# Patient Record
Sex: Male | Born: 1960
Health system: Southern US, Community
[De-identification: ages and names within clinical notes are randomized; demographics above are authoritative.]

## PROBLEM LIST (undated history)

## (undated) DIAGNOSIS — K219 Gastro-esophageal reflux disease without esophagitis: Secondary | ICD-10-CM

## (undated) DIAGNOSIS — K259 Gastric ulcer, unspecified as acute or chronic, without hemorrhage or perforation: Secondary | ICD-10-CM

## (undated) DIAGNOSIS — J302 Other seasonal allergic rhinitis: Secondary | ICD-10-CM

## (undated) DIAGNOSIS — G43909 Migraine, unspecified, not intractable, without status migrainosus: Secondary | ICD-10-CM

## (undated) DIAGNOSIS — E785 Hyperlipidemia, unspecified: Secondary | ICD-10-CM

## (undated) DIAGNOSIS — D126 Benign neoplasm of colon, unspecified: Secondary | ICD-10-CM

## (undated) DIAGNOSIS — M1990 Unspecified osteoarthritis, unspecified site: Secondary | ICD-10-CM

## (undated) DIAGNOSIS — T7840XA Allergy, unspecified, initial encounter: Secondary | ICD-10-CM

## (undated) HISTORY — DX: Benign neoplasm of colon, unspecified: D12.6

## (undated) HISTORY — DX: Other seasonal allergic rhinitis: J30.2

## (undated) HISTORY — DX: Unspecified osteoarthritis, unspecified site: M19.90

## (undated) HISTORY — PX: UPPER GASTROINTESTINAL ENDOSCOPY: SHX188

## (undated) HISTORY — DX: Allergy, unspecified, initial encounter: T78.40XA

## (undated) HISTORY — DX: Gastro-esophageal reflux disease without esophagitis: K21.9

## (undated) HISTORY — PX: POLYPECTOMY: SHX149

## (undated) HISTORY — DX: Gastric ulcer, unspecified as acute or chronic, without hemorrhage or perforation: K25.9

## (undated) HISTORY — PX: HYDROCELE EXCISION: SHX482

## (undated) HISTORY — DX: Migraine, unspecified, not intractable, without status migrainosus: G43.909

## (undated) HISTORY — PX: COLONOSCOPY: SHX174

## (undated) HISTORY — DX: Hyperlipidemia, unspecified: E78.5

## (undated) HISTORY — PX: WISDOM TOOTH EXTRACTION: SHX21

## (undated) HISTORY — PX: TIBIA FRACTURE SURGERY: SHX806

---

## 2002-08-11 ENCOUNTER — Encounter: Payer: Self-pay | Admitting: Internal Medicine

## 2002-08-11 ENCOUNTER — Encounter: Admission: RE | Admit: 2002-08-11 | Discharge: 2002-08-11 | Payer: Self-pay | Admitting: Internal Medicine

## 2015-09-07 ENCOUNTER — Emergency Department (HOSPITAL_BASED_OUTPATIENT_CLINIC_OR_DEPARTMENT_OTHER)
Admission: EM | Admit: 2015-09-07 | Discharge: 2015-09-07 | Disposition: A | Discharge status: Home / Self Care | Payer: BLUE CROSS/BLUE SHIELD | Attending: Emergency Medicine | Admitting: Emergency Medicine

## 2015-09-07 ENCOUNTER — Encounter (HOSPITAL_BASED_OUTPATIENT_CLINIC_OR_DEPARTMENT_OTHER): Payer: Self-pay | Admitting: *Deleted

## 2015-09-07 ENCOUNTER — Emergency Department (HOSPITAL_BASED_OUTPATIENT_CLINIC_OR_DEPARTMENT_OTHER): Payer: BLUE CROSS/BLUE SHIELD

## 2015-09-07 DIAGNOSIS — R202 Paresthesia of skin: Secondary | ICD-10-CM | POA: Diagnosis not present

## 2015-09-07 DIAGNOSIS — R079 Chest pain, unspecified: Secondary | ICD-10-CM | POA: Diagnosis present

## 2015-09-07 DIAGNOSIS — R0789 Other chest pain: Secondary | ICD-10-CM | POA: Diagnosis not present

## 2015-09-07 DIAGNOSIS — Z87891 Personal history of nicotine dependence: Secondary | ICD-10-CM | POA: Diagnosis not present

## 2015-09-07 DIAGNOSIS — R200 Anesthesia of skin: Secondary | ICD-10-CM | POA: Diagnosis not present

## 2015-09-07 LAB — CBC
HCT: 46.2 % (ref 39.0–52.0)
HEMOGLOBIN: 15.5 g/dL (ref 13.0–17.0)
MCH: 30.5 pg (ref 26.0–34.0)
MCHC: 33.5 g/dL (ref 30.0–36.0)
MCV: 90.8 fL (ref 78.0–100.0)
PLATELETS: 209 10*3/uL (ref 150–400)
RBC: 5.09 MIL/uL (ref 4.22–5.81)
RDW: 12.7 % (ref 11.5–15.5)
WBC: 4.4 10*3/uL (ref 4.0–10.5)

## 2015-09-07 LAB — BASIC METABOLIC PANEL
ANION GAP: 8 (ref 5–15)
BUN: 21 mg/dL — AB (ref 6–20)
CALCIUM: 10.6 mg/dL — AB (ref 8.9–10.3)
CO2: 26 mmol/L (ref 22–32)
CREATININE: 0.99 mg/dL (ref 0.61–1.24)
Chloride: 102 mmol/L (ref 101–111)
GFR calc Af Amer: >60 mL/min (ref >60)
GLUCOSE: 120 mg/dL — AB (ref 65–99)
Potassium: 4.6 mmol/L (ref 3.5–5.1)
Sodium: 136 mmol/L (ref 135–145)

## 2015-09-07 LAB — TROPONIN I

## 2015-09-07 MED ORDER — IBUPROFEN 600 MG PO TABS: 600.0000 mg | ORAL_TABLET | Freq: Four times a day (QID) | ORAL | Status: DC | PRN

## 2015-09-07 MED ORDER — ASPIRIN 81 MG PO CHEW: 324.0000 mg | CHEWABLE_TABLET | Freq: Once | ORAL | Status: DC

## 2015-09-07 NOTE — ED Notes (Signed)
Pt placed on auto vitals Q30.  

## 2015-09-07 NOTE — ED Notes (Signed)
Pt amb to room 5 with quick steady gait smiling in nad. Pt reports chest "soreness" pt points to one pinpoint area in the center/left chest that is tender and sore. Pt states he has had this off and on x December. Pt states he also has left arm tingling, but attributes this to his rotator cuff problem in his left arm. Pt denies any cp at this time, "just a little numbness in my left arm, but it's better today than yesterday." pt denies any nausea, dizzyness, sweating, or sob.

## 2015-09-07 NOTE — Discharge Instructions (Signed)
Chest Wall Pain °Chest wall pain is pain in or around the bones and muscles of your chest. Sometimes, an injury causes this pain. Sometimes, the cause may not be known. This pain may take several weeks or longer to get better. °HOME CARE INSTRUCTIONS  °Pay attention to any changes in your symptoms. Take these actions to help with your pain:  °· Rest as told by your health care provider.   °· Avoid activities that cause pain. These include any activities that use your chest muscles or your abdominal and side muscles to lift heavy items.    °· If directed, apply ice to the painful area: °· Put ice in a plastic bag. °· Place a towel between your skin and the bag. °· Leave the ice on for 20 minutes, 2-3 times per day. °· Take over-the-counter and prescription medicines only as told by your health care provider. °· Do not use tobacco products, including cigarettes, chewing tobacco, and e-cigarettes. If you need help quitting, ask your health care provider. °· Keep all follow-up visits as told by your health care provider. This is important. °SEEK MEDICAL CARE IF: °· You have a fever. °· Your chest pain becomes worse. °· You have new symptoms. °SEEK IMMEDIATE MEDICAL CARE IF: °· You have nausea or vomiting. °· You feel sweaty or light-headed. °· You have a cough with phlegm (sputum) or you cough up blood. °· You develop shortness of breath. °  °This information is not intended to replace advice given to you by your health care provider. Make sure you discuss any questions you have with your health care provider. °  °Document Released: 08/19/2005 Document Revised: 05/10/2015 Document Reviewed: 11/14/2014 °Elsevier Interactive Patient Education ©2016 Elsevier Inc. ° °Paresthesia °Paresthesia is an abnormal burning or prickling sensation. This sensation is generally felt in the hands, arms, legs, or feet. However, it may occur in any part of the body. Usually, it is not painful. The feeling may be described as: °· Tingling  or numbness. °· Pins and needles. °· Skin crawling. °· Buzzing. °· Limbs falling asleep. °· Itching. °Most people experience temporary (transient) paresthesia at some time in their lives. Paresthesia may occur when you breathe too quickly (hyperventilation). It can also occur without any apparent cause. Commonly, paresthesia occurs when pressure is placed on a nerve. The sensation quickly goes away after the pressure is removed. For some people, however, paresthesia is a long-lasting (chronic) condition that is caused by an underlying disorder. If you continue to have paresthesia, you may need further medical evaluation. °HOME CARE INSTRUCTIONS °Watch your condition for any changes. Taking the following actions may help to lessen any discomfort that you are feeling: °· Avoid drinking alcohol. °· Try acupuncture or massage to help relieve your symptoms. °· Keep all follow-up visits as directed by your health care provider. This is important. °SEEK MEDICAL CARE IF: °· You continue to have episodes of paresthesia. °· Your burning or prickling feeling gets worse when you walk. °· You have pain, cramps, or dizziness. °· You develop a rash. °SEEK IMMEDIATE MEDICAL CARE IF: °· You feel weak. °· You have trouble walking or moving. °· You have problems with speech, understanding, or vision. °· You feel confused. °· You cannot control your bladder or bowel movements. °· You have numbness after an injury. °· You faint. °  °This information is not intended to replace advice given to you by your health care provider. Make sure you discuss any questions you have with your health care provider. °  °  Document Released: 08/09/2002 Document Revised: 01/03/2015 Document Reviewed: 08/15/2014 °Elsevier Interactive Patient Education ©2016 Elsevier Inc. ° °

## 2015-09-07 NOTE — ED Notes (Signed)
MD at bedside. 

## 2015-09-07 NOTE — ED Notes (Signed)
Pt states he has had this same pain twice in his life, and his pcp "stood behind me, lifted me up, and I heard a pop" and was fine after that. Pt states "it had something to do with my sternum being out of place."

## 2015-09-07 NOTE — ED Provider Notes (Signed)
CSN: [id number]     Arrival date & time 09/07/15  [id number] History   First MD Initiated Contact with Patient 09/07/15 [phone]     Chief Complaint  Patient presents with  . Chest Pain     (Consider location/radiation/quality/duration/timing/severity/associated sxs/prior Treatment) Patient is a 55 y.o. male presenting with chest pain. The history is provided by the patient.  Chest Pain Pain location:  L chest Pain quality: sharp   Pain radiates to:  Does not radiate Pain radiates to the back: no   Pain severity:  Moderate Onset quality:  Gradual Duration:  2 weeks Timing:  Intermittent Progression:  Waxing and waning Chronicity:  New Context: breathing, lifting and at rest   Relieved by:  Nothing Worsened by:  Nothing tried Ineffective treatments:  None tried Associated symptoms: no abdominal pain, no cough, no fatigue, no fever, no shortness of breath and no syncope   Risk factors: no diabetes mellitus, no high cholesterol, no hypertension and no smoking     History reviewed. No pertinent past medical history. History reviewed. No pertinent past surgical history. No family history on file. Social History  Substance Use Topics  . Smoking status: Former Research scientist (life sciences)  . Smokeless tobacco: None  . Alcohol Use: None    Review of Systems  Constitutional: Negative for fever and fatigue.  Respiratory: Negative for cough and shortness of breath.   Cardiovascular: Positive for chest pain. Negative for syncope.  Gastrointestinal: Negative for abdominal pain.  All other systems reviewed and are negative.     Allergies  Review of patient's allergies indicates no known allergies.  Home Medications   Prior to Admission medications   Not on File   BP 163/109 mmHg  Pulse 83  Temp(Src) 97.6 F (36.4 C) (Oral)  Resp 18  Ht 6\' 2"  (1.88 m)  Wt 200 lb (90.719 kg)  BMI 25.67 kg/m2  SpO2 100% Physical Exam  Constitutional: He is oriented to person, place, and time. He appears  well-developed and well-nourished. No distress.  HENT:  Head: Normocephalic and atraumatic.  Eyes: Conjunctivae are normal.  Neck: Neck supple. No tracheal deviation present.  Cardiovascular: Normal rate, regular rhythm and normal heart sounds.   Pulmonary/Chest: Effort normal and breath sounds normal. No respiratory distress. He exhibits tenderness (at left uper sternal border).  Abdominal: Soft. He exhibits no distension.  Neurological: He is alert and oriented to person, place, and time. He exhibits normal muscle tone. Coordination normal.  Skin: Skin is warm and dry.  Psychiatric: He has a normal mood and affect.  Vitals reviewed.   ED Course  Procedures (including critical care time) Labs Review Labs Reviewed  BASIC METABOLIC PANEL - Abnormal; Notable for the following:    Glucose, Bld 120 (*)    BUN 21 (*)    Calcium 10.6 (*)    All other components within normal limits  CBC  TROPONIN I    Imaging Review Dg Chest 2 View  09/07/2015  CLINICAL DATA:  Intermittent chest pain for 1 month. Left upper extremity tingling EXAM: CHEST  2 VIEW COMPARISON:  None. FINDINGS: Lungs are clear. Heart size and pulmonary vascularity are normal. No adenopathy. No pneumothorax. No bone lesions. IMPRESSION: No edema or consolidation. Electronically Signed   By: Lowella Grip III M.D.   On: 09/07/2015 09:12   I have personally reviewed and evaluated these images and lab results as part of my medical decision-making.   EKG Interpretation   Date/Time:  Thursday September 07 2015 08:49:05  EST Ventricular Rate:  84 PR Interval:  164 QRS Duration: 85 QT Interval:  354 QTC Calculation: 418 R Axis:   45 Text Interpretation:  Sinus rhythm Normal ECG No previous tracing  Confirmed by Araseli Sherry MD, Vladimir Lenhoff [id number]) on 09/07/2015 9:36:31 AM      MDM   Final diagnoses:  Chest wall pain  Paresthesia of left arm    55 y.o. male presents with focal left-sided chest pain and some numbness in his left  arm which has been transient. Symptoms are atypical for ACS and reproducible on exam, no EKG findings or troponin elevation to suggest concerning etiology of a cardiac nature. No further neurologic symptoms other than intermittent mild numbness of the left arm. Patient otherwise well-appearing. Plan to follow up with PCP as needed and return precautions discussed for worsening or new concerning symptoms.     Leo Grosser, MD 09/07/15 [phone]

## 2015-09-29 ENCOUNTER — Ambulatory Visit: Payer: BLUE CROSS/BLUE SHIELD | Admitting: Physician Assistant

## 2016-01-04 DIAGNOSIS — Z23 Encounter for immunization: Secondary | ICD-10-CM | POA: Diagnosis not present

## 2016-08-08 DIAGNOSIS — Z23 Encounter for immunization: Secondary | ICD-10-CM | POA: Diagnosis not present

## 2017-03-06 ENCOUNTER — Telehealth: Payer: Self-pay

## 2017-03-06 NOTE — Telephone Encounter (Signed)
Pre visit call completed 

## 2017-03-07 ENCOUNTER — Ambulatory Visit (INDEPENDENT_AMBULATORY_CARE_PROVIDER_SITE_OTHER): Payer: Self-pay | Admitting: Family Medicine

## 2017-03-07 ENCOUNTER — Encounter: Payer: Self-pay | Admitting: Family Medicine

## 2017-03-07 VITALS — BP 132/90 | HR 93 | Temp 98.1°F | Ht 72.0 in | Wt 208.4 lb

## 2017-03-07 DIAGNOSIS — R030 Elevated blood-pressure reading, without diagnosis of hypertension: Secondary | ICD-10-CM

## 2017-03-07 DIAGNOSIS — N529 Male erectile dysfunction, unspecified: Secondary | ICD-10-CM

## 2017-03-07 DIAGNOSIS — W57XXXA Bitten or stung by nonvenomous insect and other nonvenomous arthropods, initial encounter: Secondary | ICD-10-CM

## 2017-03-07 DIAGNOSIS — Z23 Encounter for immunization: Secondary | ICD-10-CM

## 2017-03-07 DIAGNOSIS — M79672 Pain in left foot: Secondary | ICD-10-CM

## 2017-03-07 NOTE — Progress Notes (Signed)
Pre visit review using our clinic review tool, if applicable. No additional management support is needed unless otherwise documented below in the visit note. 

## 2017-03-07 NOTE — Patient Instructions (Addendum)
Around 3 times per week, check your blood pressure 4 times per day. Twice in the morning and twice in the evening. The readings should be at least one minute apart. Write down these values and bring them to your next nurse visit/appointment.  When you check your BP, make sure you have been doing something calm/relaxing 5 minutes prior to checking. Both feet should be flat on the floor and you should be sitting. Use your left arm and make sure it is in a relaxed position (on a table), and that the cuff is at the approximate level/height of your heart.  Call insurance company about erectile dysfunction medicine coverage. Let me know and I will cal it in.  If your foot pain returns, OK to come here or try Naproxen (Aleve), 2 tabs twice daily to help. This is what would be recommended if you did have gout. If you continue to have frequent pain from this, let us know.  Be fasting for your physical, 9-12 hours without calories.

## 2017-03-07 NOTE — Progress Notes (Signed)
Chief Complaint  Patient presents with  . Establish Care       New Patient Visit SUBJECTIVE: HPI: Ralph Figueroa is an 56 y.o.male who is being seen for establishing care.  The patient was previously seen at [facility] in [place], [place].  The patient broke his foot four years ago. He has had 2 episodes since then where his foot became swollen, warm, and very painful. This happened fairly recently and he was concerned for gout. He went to a clinic and they drew a uric acid level and placed him on a steroid pack. He immediately improved with his symptoms. Reportedly, the uric acid was unremarkable. He has not had any issues since and does not have any current complaints of his foot. There was no injury, change in footwear, change in activity, or change in diet. He has never had joint fluid aspiration.  He also reports being bitten by tick around 6 weeks ago when he was up in [place]. He is concerned about signs and symptoms of Lyme disease. He denies any bull's-eye rash, joint pain, muscle pain, fevers, or facial droop.  Lastly, he is interested in getting a rx for Viagra. As he continues to age, he is unable to achieve/maintain an erection as adequately as he was doing the past. It is affecting his sex life. No discharge or pain with urination.  No Known Allergies  Past Medical History:  Diagnosis Date  . Seasonal allergies    Past Surgical History:  Procedure Laterality Date  . NO PAST SURGERIES     Social History   Social History  . Marital status: Married   Social History Main Topics  . Smoking status: Former Research scientist (life sciences)  . Smokeless tobacco: Never Used  . Alcohol use Yes  . Drug use: No   No family history on file.   Current Outpatient Prescriptions:  .  ibuprofen (ADVIL,MOTRIN) 600 MG tablet, Take 1 tablet (600 mg total) by mouth every 6 (six) hours as needed. (Patient not taking: Reported on 03/06/2017), Disp: 30 tablet, Rfl: 0  ROS MSK: Denies current foot pain  Neuro:  Denies numbness or tingling   OBJECTIVE: BP 132/90 (BP Location: Left Arm, Patient Position: Sitting, Cuff Size: Normal)   Pulse 93   Temp 98.1 F (36.7 C) (Oral)   Ht 6' (1.829 m)   Wt 208 lb 6 oz (94.5 kg)   SpO2 96%   BMI 28.26 kg/m   Constitutional: -  VS reviewed -  Well developed, well nourished, appears stated age -  No apparent distress  Psychiatric: -  Oriented to person, place, and time -  Memory intact -  Affect and mood normal -  Fluent conversation, good eye contact -  Judgment and insight age appropriate  Eye: -  Conjunctivae clear, no discharge -  Pupils symmetric, round, reactive to light  ENMT: -  Oral mucosa without lesions, tongue and uvula midline    Tonsils not enlarged, no erythema, no exudate, trachea midline    Pharynx moist, no lesions, no erythema  Neck: -  No gross swelling, no palpable masses -  Thyroid midline, not enlarged, mobile, no palpable masses  Cardiovascular: -  RRR, no murmurs -  No LE edema  Respiratory: -  Normal respiratory effort, no accessory muscle use, no retraction -  Breath sounds equal, no wheezes, no ronchi, no crackles  Neurological:  -  CN II - XII grossly intact -  Sensation intact to light touch over feet b/l  Musculoskeletal: -  No TTP or deformity of foot; no warmth or erythema, no edema; mild if any pes planus -  Gait normal  Skin: -  Small area of healing where he was bitten by tick; no erythema, drainage, fluctuance, fluid filled lesions, bulls-eye rash, warmth; Otherwise, no significant lesion on inspection -  Warm and dry to palpation   ASSESSMENT/PLAN: Left foot pain  Need for Tdap vaccination - Plan: Tdap vaccine greater than or equal to 7yo IM  Elevated blood pressure reading  Erectile dysfunction, unspecified erectile dysfunction type  Tick bite, initial encounter  Patient instructed to sign release of records form from his previous PCP. Regarding his left foot pain, it sounds like a typical response  for a gout flare. Cannot rule out without a normal arthrocentesis. Dietary guidelines for gout provided for patient. Discussed using over-the-counter naproxen should he have an issue like this in the future. He is also welcome to return to our clinic. Regarding his elevated blood pressure, would like and return to clinic in 2 weeks for a nurse visit. Bring in the home blood pressure monitor he just got. Check home blood pressure 4 times a day, 3 times a week and write down the readings. Patient will contact insurance co to see what erectile dysfunction medications are covered by his plan. I do not believe he contracted Lyme disease based on his lack of symptoms. Patient should return for nurse visit and at earliest convenience for CPE. The patient voiced understanding and agreement to the plan.   [place], DO 03/07/17  4:50 PM

## 2017-03-21 ENCOUNTER — Encounter: Payer: Self-pay | Admitting: Family Medicine

## 2017-03-21 ENCOUNTER — Ambulatory Visit (INDEPENDENT_AMBULATORY_CARE_PROVIDER_SITE_OTHER): Payer: 59 | Admitting: Family Medicine

## 2017-03-21 VITALS — BP 120/78 | HR 69 | Temp 98.0°F | Ht 72.0 in | Wt 213.2 lb

## 2017-03-21 DIAGNOSIS — Z1211 Encounter for screening for malignant neoplasm of colon: Secondary | ICD-10-CM

## 2017-03-21 DIAGNOSIS — Z0000 Encounter for general adult medical examination without abnormal findings: Secondary | ICD-10-CM | POA: Diagnosis not present

## 2017-03-21 LAB — LIPID PANEL
CHOLESTEROL: 242 mg/dL — AB (ref 0–200)
HDL: 60.3 mg/dL (ref >39.00)
LDL Cholesterol: 168 mg/dL — ABNORMAL HIGH (ref 0–99)
NonHDL: 181.83
TRIGLYCERIDES: 71 mg/dL (ref 0.0–149.0)
Total CHOL/HDL Ratio: 4
VLDL: 14.2 mg/dL (ref 0.0–40.0)

## 2017-03-21 LAB — COMPREHENSIVE METABOLIC PANEL
ALBUMIN: 4.4 g/dL (ref 3.5–5.2)
ALK PHOS: 43 U/L (ref 39–117)
ALT: 14 U/L (ref 0–53)
AST: 16 U/L (ref 0–37)
BILIRUBIN TOTAL: 0.3 mg/dL (ref 0.2–1.2)
BUN: 20 mg/dL (ref 6–23)
CO2: 29 mEq/L (ref 19–32)
Calcium: 9.2 mg/dL (ref 8.4–10.5)
Chloride: 104 mEq/L (ref 96–112)
Creatinine, Ser: 0.85 mg/dL (ref 0.40–1.50)
GFR: 98.4 mL/min (ref >60.00)
GLUCOSE: 114 mg/dL — AB (ref 70–99)
Potassium: 4.1 mEq/L (ref 3.5–5.1)
Sodium: 139 mEq/L (ref 135–145)
TOTAL PROTEIN: 7 g/dL (ref 6.0–8.3)

## 2017-03-21 LAB — CBC
HCT: 42.4 % (ref 39.0–52.0)
Hemoglobin: 14.2 g/dL (ref 13.0–17.0)
MCHC: 33.6 g/dL (ref 30.0–36.0)
MCV: 93.8 fl (ref 78.0–100.0)
Platelets: 188 10*3/uL (ref 150.0–400.0)
RBC: 4.52 Mil/uL (ref 4.22–5.81)
RDW: 13.3 % (ref 11.5–15.5)
WBC: 4.4 10*3/uL (ref 4.0–10.5)

## 2017-03-21 NOTE — Progress Notes (Signed)
Chief Complaint  Patient presents with  . Annual Exam    fasting    [name] is here for a complete physical.   His last physical was >1 year ago.  Current diet: in general, a "healthy" diet, could be better   Current exercise: walks daily Weight trend: stable Does pt snore? No. Daytime fatigue? No. Seat belt? Yes.    Health maintenance Colonoscopy- No  Tetanus- Yes HIV- Yes Hep C- Yes Prostate cancer screening- No   Past Medical History:  Diagnosis Date  . Seasonal allergies     Past Surgical History:  Procedure Laterality Date  . NO PAST SURGERIES     Medications  ASA 325 mg daily   Allergies No Known Allergies   Family History Family History  Problem Relation Age of Onset  . Cancer Neg Hx     Review of Systems: Constitutional:  no unexpected change in weight, no fevers or chills Eye:  no recent significant change in vision Ear/Nose/Mouth/Throat:  Ears:  no tinnitus or hearing loss Nose/Mouth/Throat:  no complaints of nasal congestion or bleeding, no sore throat and oral sores Cardiovascular:  no chest pain, no palpitations Respiratory:  no cough and no shortness of breath Gastrointestinal:  no abdominal pain, no change in bowel habits, no nausea, vomiting, diarrhea, or constipation and no black or bloody stool GU:  Male: negative for dysuria, frequency, and incontinence and negative for prostate symptoms Musculoskeletal/Extremities: Still having L foot issue, not painful; otherwise no pain, redness, or swelling of the joints Integumentary (Skin/Breast):  no abnormal skin lesions reported Neurologic:  no headaches, no numbness, tingling Endocrine: No weight changes, masses in the neck, heat/cold intolerance, bowel or skin changes, or cardiovascular system symptoms Hematologic/Lymphatic:  no abnormal bleeding, no HIV risk factors, no night sweats, no swollen nodes, no weight loss  Exam BP 120/78 (BP Location: Right Arm, Patient Position:  Sitting, Cuff Size: Large)   Pulse 69   Temp 98 F (36.7 C) (Oral)   Ht 6' (1.829 m)   Wt 213 lb 3.2 oz (96.7 kg)   SpO2 98%   BMI 28.92 kg/m  General:  well developed, well nourished, in no apparent distress Skin:  Patch of dry skin on helix of L ear; otherwise no significant moles, warts, or growths Head:  no masses, lesions, or tenderness Eyes:  pupils equal and round, sclera anicteric without injection Ears:  canals without lesions, TMs shiny without retraction, no obvious effusion, no erythema Nose:  nares patent, septum midline, mucosa normal Throat/Pharynx:  lips and gingiva without lesion; tongue and uvula midline; non-inflamed pharynx; no exudates or postnasal drainage Neck: neck supple without adenopathy, thyromegaly, or masses Lungs:  clear to auscultation, breath sounds equal bilaterally, no respiratory distress Cardio:  regular rate and rhythm without murmurs, heart sounds without clicks or rubs Abdomen:  abdomen soft, nontender; bowel sounds normal; no masses or organomegaly Genital (male): circumcised penis, no lesions or discharge; testes present bilaterally without masses or tenderness Rectal: Deferred Musculoskeletal:  symmetrical muscle groups noted without atrophy or deformity Extremities:  no clubbing, cyanosis, or edema, no deformities, no skin discoloration Neuro:  gait normal; deep tendon reflexes normal and symmetric Psych: well oriented with normal range of affect and appropriate judgment/insight  Assessment and Plan  Well adult exam - Plan: Comprehensive metabolic panel, CBC, Lipid panel  Screen for colon cancer - Plan: Ambulatory referral to Gastroenterology   Well 56 y.o. male. Counseled on risks and benefits of prostate  cancer screening with PSA. The patient opted to hold off for now to think about it. He is welcome to notify our office at any time if he changes his mind. He does not need an appointment if he is interested in the test. Counseled on  diet and exercise. Immunizations, labs, and further orders as above. Follow up if lesion on ear does not heal. Will freeze due to concern for [place] at that time. The patient voiced understanding and agreement to the plan.  [place], DO 03/21/17 10:36 AM

## 2017-03-21 NOTE — Patient Instructions (Addendum)
If you do not hear anything about your referral in the next 1-2 weeks, call our office and ask for an update.  The area on your ear I would expect to go away within the next month. If it lingers beyond that, follow up with our office.  Give [place] 2-3 business days to get the results of your labs back. If labs are normal, you will likely receive a letter in the mail unless you have MyChart. This can take longer than 2-3 business days.

## 2017-03-25 ENCOUNTER — Telehealth: Payer: Self-pay | Admitting: Family Medicine

## 2017-03-25 DIAGNOSIS — E782 Mixed hyperlipidemia: Secondary | ICD-10-CM

## 2017-03-25 NOTE — Telephone Encounter (Signed)
Caller name: Relation to YK:DXIP Call back number: [phone] Pharmacy:  Reason for call: pt is returning your call regarding lab results, please call back.

## 2017-03-26 NOTE — Telephone Encounter (Signed)
-----   Message from [name], DO sent at 03/21/2017  4:41 PM EDT ----- 10 yr CVD is 6.8%. LDL is higher than we would like. Other labs WNL.   AB- let pt know his cholesterol is a little high. I believe we noted that his diet is not the healthiest right now. We do have the option of starting a medicine, or we can really focus on cleaning up the diet and staying physically active. We could recheck in 3 mo if willing or he can follow up on this with his future provider. Please place orders based on his preference. TY.

## 2017-03-26 NOTE — Telephone Encounter (Signed)
Called and spoke with the pt and informed him of recent lab results and note.  Pt verbalized understanding and stated that he will focus on cleaning up his diet and stay physically active.  And he will recheck the Lipids in 3 months.  Pt will call back to schedule a lab appt for 3 mos.  Future labs ordered and sent.//AB/CMA

## 2017-04-09 ENCOUNTER — Encounter: Payer: Self-pay | Admitting: Family Medicine

## 2017-04-09 ENCOUNTER — Ambulatory Visit (INDEPENDENT_AMBULATORY_CARE_PROVIDER_SITE_OTHER): Payer: 59 | Admitting: Family Medicine

## 2017-04-09 VITALS — BP 138/86 | HR 73 | Temp 98.2°F | Ht 72.0 in | Wt 213.0 lb

## 2017-04-09 DIAGNOSIS — R1011 Right upper quadrant pain: Secondary | ICD-10-CM

## 2017-04-09 MED ORDER — DICYCLOMINE HCL 20 MG PO TABS: 20.0000 mg | ORAL_TABLET | Freq: Three times a day (TID) | ORAL | 0 refills | Status: DC

## 2017-04-09 NOTE — Progress Notes (Signed)
Chief Complaint  Patient presents with  . Abdominal Pain    x 2 1/2 weeks-bloated,tender on (R) side(comes and goes)     Ralph Figueroa is here for R sided abdominal pain.  Duration: 2.5 weeks  Intermittent, worse after eating fatty/unhealthy foods Nighttime awakenings? No Bleeding? No Weight loss? No  Palliation: Ibuprofen Provocation: Times as the day wears own; greasy foods Associated symptoms: bloating Denies: fever, nausea, vomiting and inability to keep down fluids Treatment to date: Ibuprofen  ROS: Constitutional: No fevers GI: No N/V/D/C, no bleeding + pain  Past Medical History:  Diagnosis Date  . Seasonal allergies    Family History  Problem Relation Age of Onset  . Cancer Neg Hx    Past Surgical History:  Procedure Laterality Date  . NO PAST SURGERIES      BP 138/86 (BP Location: Left Arm, Patient Position: Sitting, Cuff Size: Normal)   Pulse 73   Temp 98.2 F (36.8 C) (Oral)   Ht 6' (1.829 m)   Wt 213 lb (96.6 kg)   SpO2 99%   BMI 28.89 kg/m  Gen.: Awake, alert, appears stated age 56: Mucous membranes moist without mucosal lesions Heart: Regular rate and rhythm without murmurs Lungs: Clear auscultation bilaterally, no rales or wheezing, normal effort without accessory muscle use. Abdomen: Bowel sounds are present. Abdomen is soft, TTP in RUQ, nondistended, no masses or organomegaly. Negative Murphy's, Rovsing's, McBurney's, and Carnett's sign. Psych: Age appropriate judgment and insight. Normal mood and affect.  Right upper quadrant pain - Plan: US Abdomen Limited RUQ, dicyclomine (BENTYL) 20 MG tablet  Concern for biliary colic/inflammation. RUQ [place], avoid fatty foods, trial Bentyl. Keep symptoms diary/mental note and avoid those foods. If symptoms persist in absence of nml [place], will consider ERCP vs referral to GI. F/u prn. Pt voiced understanding and agreement to the plan.  [place], DO 58/08/18 7:50 AM

## 2017-04-09 NOTE — Patient Instructions (Addendum)
Try to avoid fatty/greasy foods.  If anything changes, let us know.  Let me know if you need anything.

## 2017-04-11 ENCOUNTER — Ambulatory Visit (HOSPITAL_BASED_OUTPATIENT_CLINIC_OR_DEPARTMENT_OTHER)
Admission: RE | Admit: 2017-04-11 | Discharge: 2017-04-11 | Disposition: A | Discharge status: Home / Self Care | Payer: 59 | Source: Ambulatory Visit | Attending: Family Medicine | Admitting: Family Medicine

## 2017-04-11 DIAGNOSIS — R1011 Right upper quadrant pain: Secondary | ICD-10-CM | POA: Insufficient documentation

## 2017-05-09 ENCOUNTER — Encounter: Payer: Self-pay | Admitting: Family Medicine

## 2017-05-09 ENCOUNTER — Encounter: Payer: Self-pay | Admitting: Physician Assistant

## 2017-05-09 ENCOUNTER — Ambulatory Visit (INDEPENDENT_AMBULATORY_CARE_PROVIDER_SITE_OTHER): Payer: 59 | Admitting: Family Medicine

## 2017-05-09 VITALS — BP 120/82 | HR 92 | Temp 98.4°F | Ht 72.0 in | Wt 218.1 lb

## 2017-05-09 DIAGNOSIS — R1011 Right upper quadrant pain: Secondary | ICD-10-CM | POA: Diagnosis not present

## 2017-05-09 DIAGNOSIS — R202 Paresthesia of skin: Secondary | ICD-10-CM

## 2017-05-09 LAB — HEPATIC FUNCTION PANEL
ALBUMIN: 4.6 g/dL (ref 3.5–5.2)
ALT: 13 U/L (ref 0–53)
AST: 16 U/L (ref 0–37)
Alkaline Phosphatase: 52 U/L (ref 39–117)
BILIRUBIN DIRECT: 0 mg/dL (ref 0.0–0.3)
BILIRUBIN TOTAL: 0.3 mg/dL (ref 0.2–1.2)
TOTAL PROTEIN: 7.1 g/dL (ref 6.0–8.3)

## 2017-05-09 LAB — BASIC METABOLIC PANEL
BUN: 19 mg/dL (ref 6–23)
CO2: 28 mEq/L (ref 19–32)
CREATININE: 0.82 mg/dL (ref 0.40–1.50)
Calcium: 9.5 mg/dL (ref 8.4–10.5)
Chloride: 104 mEq/L (ref 96–112)
GFR: 102.51 mL/min (ref >60.00)
Glucose, Bld: 107 mg/dL — ABNORMAL HIGH (ref 70–99)
POTASSIUM: 4.3 meq/L (ref 3.5–5.1)
Sodium: 140 mEq/L (ref 135–145)

## 2017-05-09 LAB — TSH: TSH: 1.36 u[IU]/mL (ref 0.35–4.50)

## 2017-05-09 LAB — VITAMIN B12: VITAMIN B 12: 374 pg/mL (ref 211–911)

## 2017-05-09 LAB — MAGNESIUM: Magnesium: 1.9 mg/dL (ref 1.5–2.5)

## 2017-05-09 NOTE — Progress Notes (Signed)
Chief Complaint  Patient presents with  . GI Problem    Subjective: Patient is a 56 y.o. male here for f/u GI issues.  RUQ pain, post-prandial. Issue has been going on for around 7 weeks now. RUQ [place] neg. He has been tx'd with Bentyl. No improvement, but not getting worse. Still intermittent, worsening throughout the day. Assoc "prickling sensation" of forearms and hands. No weakness, pain, decreased sensation, new topicals, medication changes, SOB, chest pain, fevers, or urinary complaints. Continues to deny bleeding, fevers, unintentional wt loss, or nighttime awakenings.   ROS: Heart: Denies chest pain  Lungs: Denies SOB GI: As noted in HPI Const: No fevers   Family History  Problem Relation Age of Onset  . Cancer Neg Hx    Past Medical History:  Diagnosis Date  . Seasonal allergies    No Known Allergies  Current Outpatient Prescriptions:  .  aspirin EC 325 MG tablet, Take 1 tablet (325 mg total) by mouth daily., Disp: 30 tablet, Rfl: 0 .  ibuprofen (ADVIL,MOTRIN) 200 MG tablet, Take 200 mg by mouth every 6 (six) hours as needed., Disp: , Rfl:   Objective: BP 120/82 (BP Location: Left Arm, Patient Position: Sitting, Cuff Size: Large)   Pulse 92   Temp 98.4 F (36.9 C) (Oral)   Ht 6' (1.829 m)   Wt 218 lb 2 oz (98.9 kg)   SpO2 97%   BMI 29.58 kg/m  General: Awake, appears stated age HEENT: MMM, EOMi Heart: RRR, no murmurs Lungs: CTAB, no rales, wheezes or rhonchi. No accessory muscle use Abd: BS+, soft, TTP in RUQ- mild, mild distension, no masses or organomegaly, neg Murphy's, Carnett's, McBurney's, Rovsing's Skin: No lesions, no jaundice noted; warm and dry MSK: 5/5 strength throughout, gait normal Neuro: DTR's equal and symmetric b/l Psych: Age appropriate judgment and insight, normal affect and mood  Assessment and Plan: Right upper quadrant pain - Plan: Ambulatory referral to Gastroenterology, Hepatic function panel, Basic metabolic panel  Paresthesias -  Plan: Magnesium, Basic metabolic panel, TSH, D53  Orders as above. Interested in [name]. Refer to GI for both colonoscopy and further evaluation. Cont NSAID if it is helpful. MSK cause on mind, but would like reassurance from GI. Offered HIDA, but we decided to wait on GI. Stay well hydrated. F/u prn. The patient voiced understanding and agreement to the plan.  [place], DO 05/09/17  7:26 AM

## 2017-05-09 NOTE — Progress Notes (Signed)
Pre visit review using our clinic review tool, if applicable. No additional management support is needed unless otherwise documented below in the visit note. 

## 2017-05-09 NOTE — Patient Instructions (Addendum)
If you do not hear anything about your referral in the next week, call our office and ask for an update.  Let me know if anything changes.

## 2017-05-23 ENCOUNTER — Ambulatory Visit (INDEPENDENT_AMBULATORY_CARE_PROVIDER_SITE_OTHER): Payer: 59 | Admitting: Physician Assistant

## 2017-05-23 ENCOUNTER — Other Ambulatory Visit (INDEPENDENT_AMBULATORY_CARE_PROVIDER_SITE_OTHER): Payer: 59

## 2017-05-23 ENCOUNTER — Encounter: Payer: Self-pay | Admitting: Physician Assistant

## 2017-05-23 VITALS — BP 140/94 | HR 84 | Ht 71.0 in | Wt 215.1 lb

## 2017-05-23 DIAGNOSIS — R202 Paresthesia of skin: Secondary | ICD-10-CM | POA: Diagnosis not present

## 2017-05-23 DIAGNOSIS — Z1211 Encounter for screening for malignant neoplasm of colon: Secondary | ICD-10-CM

## 2017-05-23 DIAGNOSIS — G43909 Migraine, unspecified, not intractable, without status migrainosus: Secondary | ICD-10-CM | POA: Insufficient documentation

## 2017-05-23 DIAGNOSIS — R1013 Epigastric pain: Secondary | ICD-10-CM

## 2017-05-23 DIAGNOSIS — R1011 Right upper quadrant pain: Secondary | ICD-10-CM | POA: Diagnosis not present

## 2017-05-23 LAB — CBC WITH DIFFERENTIAL/PLATELET
BASOS PCT: 1 % (ref 0.0–3.0)
Basophils Absolute: 0 10*3/uL (ref 0.0–0.1)
EOS ABS: 0.1 10*3/uL (ref 0.0–0.7)
Eosinophils Relative: 1.4 % (ref 0.0–5.0)
HEMATOCRIT: 44 % (ref 39.0–52.0)
Hemoglobin: 15 g/dL (ref 13.0–17.0)
LYMPHS ABS: 1.5 10*3/uL (ref 0.7–4.0)
Lymphocytes Relative: 31.2 % (ref 12.0–46.0)
MCHC: 34.2 g/dL (ref 30.0–36.0)
MCV: 92.4 fl (ref 78.0–100.0)
MONO ABS: 0.5 10*3/uL (ref 0.1–1.0)
Monocytes Relative: 9.5 % (ref 3.0–12.0)
NEUTROS ABS: 2.8 10*3/uL (ref 1.4–7.7)
NEUTROS PCT: 56.9 % (ref 43.0–77.0)
PLATELETS: 198 10*3/uL (ref 150.0–400.0)
RBC: 4.76 Mil/uL (ref 4.22–5.81)
RDW: 13.5 % (ref 11.5–15.5)
WBC: 4.9 10*3/uL (ref 4.0–10.5)

## 2017-05-23 LAB — FOLATE: Folate: 9.6 ng/mL (ref >5.9)

## 2017-05-23 LAB — SEDIMENTATION RATE: Sed Rate: 10 mm/hr (ref 0–20)

## 2017-05-23 LAB — VITAMIN D 25 HYDROXY (VIT D DEFICIENCY, FRACTURES): VITD: 29.62 ng/mL — AB (ref 30.00–100.00)

## 2017-05-23 LAB — VITAMIN B12: VITAMIN B 12: 492 pg/mL (ref 211–911)

## 2017-05-23 MED ORDER — NA SULFATE-K SULFATE-MG SULF 17.5-3.13-1.6 GM/177ML PO SOLN
ORAL | 0 refills | Status: DC
Start: 2017-05-23 — End: 2017-09-09

## 2017-05-23 MED ORDER — PANTOPRAZOLE SODIUM 40 MG PO TBEC: DELAYED_RELEASE_TABLET | ORAL | 3 refills | Status: DC

## 2017-05-23 NOTE — Progress Notes (Signed)
Subjective:    Patient ID: Ralph Figueroa, male    DOB: 04/26/1961, 56 y.o.   MRN: [id number]  HPI Ralph Figueroa is a very nice 55 year old white male, new to GI today referred by [name] D.O. for evaluation of right upper quadrant pain. Patient has no prior GI history.   He says his current pain started about 2 months ago and has fluctuated in intensity. He has had some associated bloating off and on and belching. He also mentions that he started having symptoms of his skin feeling" prickly "around that same time and this has continued. He describes the abdominal discomfort as pressure-like and aching with some radiation across his upper abdomen at times. He has not noticed any change with by mouth intake or on an empty stomach. No associated nausea or vomiting. Appetite has been fine and weight has been stable any issues with heartburn or indigestion no dysphagia. His bowel movements have been normal no melena or hematochezia.  He does have history of migraine headaches and generally takes 2 regular strength aspirin every day. He says he has been tried on other medications without success and aspirin has been what works for him. He has also recently been taking ibuprofen about 6 tablets per day which she started around the time his abdominal pain started. No family history of colon cancer polyps or other GI disease. Recent labs on 05/09/2017 with hepatic panel and BMETt within normal limits almond no CBC done. Right upper quadrant ultrasound done on 04/11/2017 was unremarkable with a CBD of 2.4 mm.  Review of Systems Pertinent positive and negative review of systems were noted in the above HPI section.  All other review of systems was otherwise negative.  Outpatient Encounter Prescriptions as of 05/23/2017  Medication Sig  . aspirin EC 325 MG tablet Take 1 tablet (325 mg total) by mouth daily.  Marland Kitchen ibuprofen (ADVIL,MOTRIN) 200 MG tablet Take 200 mg by mouth every 6 (six) hours as needed.  . Na  Sulfate-K Sulfate-Mg Sulf 17.5-3.13-1.6 GM/180ML SOLN Take as directed for colonoscopy.  . pantoprazole (PROTONIX) 40 MG tablet Take 1 tab by mouth every morning.   No facility-administered encounter medications on file as of 05/23/2017.    No Known Allergies Patient Active Problem List   Diagnosis Date Noted  . Migraine 05/23/2017   Social History   Social History  . Marital status: Married    Spouse name: N/A  . Number of children: 2  . Years of education: N/A   Occupational History  . sales    Social History Main Topics  . Smoking status: Former Smoker    Quit date: 1988  . Smokeless tobacco: Never Used  . Alcohol use Yes     Comment: weekends  . Drug use: No  . Sexual activity: Not on file   Other Topics Concern  . Not on file   Social History Narrative  . No narrative on file    Mr. Berhow's family history is not on file.      Objective:    Vitals:   05/23/17 0832  BP: (!) 140/94  Pulse: 84    Physical Exam  well-developed white male in no acute distress, very pleasant blood pressure 140/94 pulse 84, height 5 foot 11, weight 2:15, BMI of 30. HEENT; nontraumatic normocephalic EOMI PERRLA sclera anicteric, Cardiovascular; regular rate and rhythm with S1-S2 no murmur rub or gallop, Pulmonary ;clear bilaterally, Abdomen; soft, bowel sounds are present as a mild tenderness in the  epigastrium and right upper quadrant is no guarding or rebound no palpable mass or hepatosplenomegaly, Rectal; exam not done, Extremities; no clubbing cyanosis or edema skin warm and dry, Neuropsych ;mood and affect appropriate       Assessment & Plan:   #90 56 year old white male with 2 month history of right upper quadrant/epigastric pain described as dull and aching. Recent limited right upper quadrant ultrasound unremarkable. Etiology of pain is not clear, though have prior suspicion for peptic ulcer disease given his chronic aspirin use. #2 colon cancer screening-patient has not  had previous colonoscopy #3 migraine headaches #4 2 month history of altered sensation with "prickly" skin- question neuropathy  Plan;Start Protonix 40 mg by mouth every morning Have asked patient to stop ibuprofen, and decreased aspirin use is much as possible. CBC with differential today and sedimentation rate. We'll also check vitamin D level and B12 folate  Patient will be scheduled for EGD and colonoscopy with Dr. Fuller Plan. Both procedures discussed in detail with patient including risks and benefits and he is agreeable to proceed. Procedures are scheduled for early November. Of asked the patient to call back in a couple of weeks with a progress report, if his epigastric pain has not improved at all with PPI therapy may pursue CT of the abdomen in the interim.  Mahi Zabriskie S Polo Mcmartin PA-C 05/23/2017   Cc: [name]*

## 2017-05-23 NOTE — Patient Instructions (Signed)
Please go to the basement level to have your labs drawn.  We have sent the following medications to your pharmacy for you to pick up at your convenience: 1. Pantoprazole sodium 40 mg 2. Suprep for colonoscopy  Decrease aspirin and Ibuprofen use. Call back in 2 weeks if the Pantoprazole sodium ( Protonix)  Is not helping.   You have been scheduled for a colonoscopy. Please follow written instructions given to you at your visit today.  Please pick up your prep supplies at the pharmacy within the next 1-3 days. If you use inhalers (even only as needed), please bring them with you on the day of your procedure. Your physician has requested that you go to www.startemmi.com and enter the access code given to you at your visit today. This web site gives a general overview about your procedure. However, you should still follow specific instructions given to you by our office regarding your preparation for the procedure.

## 2017-05-23 NOTE — Progress Notes (Signed)
Reviewed and agree with management plan.  Tory Mckissack T. Giovannie Scerbo, MD FACG 

## 2017-06-02 ENCOUNTER — Encounter: Payer: Self-pay | Admitting: Neurology

## 2017-06-02 ENCOUNTER — Other Ambulatory Visit: Payer: Self-pay | Admitting: Family Medicine

## 2017-06-02 ENCOUNTER — Telehealth: Payer: Self-pay | Admitting: Family Medicine

## 2017-06-02 DIAGNOSIS — R202 Paresthesia of skin: Secondary | ICD-10-CM

## 2017-06-02 NOTE — Telephone Encounter (Signed)
Referral done

## 2017-06-02 NOTE — Telephone Encounter (Signed)
OK to refer, dx tingling in extremities. TY.

## 2017-06-02 NOTE — Telephone Encounter (Signed)
Pt called in to request a referral to a neurologist. Pt says that his skin has become prickley. Pt says that he and provider has previously discussed this concern. Pt says that he was referred to a different location (GI) and they suggested neurologist.   Pt would like to be seen in [place] if possible.

## 2017-07-03 DIAGNOSIS — D126 Benign neoplasm of colon, unspecified: Secondary | ICD-10-CM

## 2017-07-03 HISTORY — DX: Benign neoplasm of colon, unspecified: D12.6

## 2017-07-07 ENCOUNTER — Ambulatory Visit (AMBULATORY_SURGERY_CENTER): Payer: 59 | Admitting: Gastroenterology

## 2017-07-07 ENCOUNTER — Encounter: Payer: Self-pay | Admitting: Gastroenterology

## 2017-07-07 VITALS — BP 119/79 | HR 78 | Temp 98.6°F | Resp 14 | Ht 71.0 in | Wt 215.0 lb

## 2017-07-07 DIAGNOSIS — R1013 Epigastric pain: Secondary | ICD-10-CM

## 2017-07-07 DIAGNOSIS — K259 Gastric ulcer, unspecified as acute or chronic, without hemorrhage or perforation: Secondary | ICD-10-CM

## 2017-07-07 DIAGNOSIS — D125 Benign neoplasm of sigmoid colon: Secondary | ICD-10-CM | POA: Diagnosis not present

## 2017-07-07 DIAGNOSIS — D120 Benign neoplasm of cecum: Secondary | ICD-10-CM | POA: Diagnosis not present

## 2017-07-07 DIAGNOSIS — K635 Polyp of colon: Secondary | ICD-10-CM | POA: Diagnosis not present

## 2017-07-07 DIAGNOSIS — Z1211 Encounter for screening for malignant neoplasm of colon: Secondary | ICD-10-CM | POA: Diagnosis present

## 2017-07-07 DIAGNOSIS — K2960 Other gastritis without bleeding: Secondary | ICD-10-CM | POA: Diagnosis not present

## 2017-07-07 MED ORDER — SODIUM CHLORIDE 0.9 % IV SOLN: 500.0000 mL | INTRAVENOUS | Status: DC

## 2017-07-07 NOTE — Progress Notes (Signed)
Report given to PACU, vss 

## 2017-07-07 NOTE — Op Note (Signed)
[facility] Patient Name: Ralph Figueroa Procedure Date: 07/07/2017 2:00 PM MRN: [id number] Endoscopist: Ladene Artist , MD Age: 56 Referring MD:  Date of Birth: 1961-03-05 Gender: Male Account #: [id number] Procedure:                Upper GI endoscopy Indications:              Epigastric abdominal pain Medicines:                Monitored Anesthesia Care Procedure:                Pre-Anesthesia Assessment:                           - Prior to the procedure, a History and Physical                            was performed, and patient medications and                            allergies were reviewed. The patient's tolerance of                            previous anesthesia was also reviewed. The risks                            and benefits of the procedure and the sedation                            options and risks were discussed with the patient.                            All questions were answered, and informed consent                            was obtained. Prior Anticoagulants: The patient has                            taken no previous anticoagulant or antiplatelet                            agents. ASA Grade Assessment: II - A patient with                            mild systemic disease. After reviewing the risks                            and benefits, the patient was deemed in                            satisfactory condition to undergo the procedure.                           After obtaining informed consent, the endoscope was  passed under direct vision. Throughout the                            procedure, the patient's blood pressure, pulse, and                            oxygen saturations were monitored continuously. The                            Endoscope was introduced through the mouth, and                            advanced to the second part of duodenum. The upper                            GI endoscopy was accomplished  without difficulty.                            The patient tolerated the procedure well. Scope In: Scope Out: Findings:                 The examined esophagus was normal.                           Two non-bleeding cratered gastric ulcers with no                            stigmata of bleeding were found in the gastric                            antrum. The largest lesion was 6 mm in largest                            dimension. Biopsies were taken with a cold forceps                            for histology/H pylori testing.                           A few dispersed, small non-bleeding erosions were                            found in the gastric body and fundus. There were no                            stigmata of recent bleeding. Biopsies were taken                            with a cold forceps for histology/H pylori testing.                           The exam of the stomach was otherwise normal.  The duodenal bulb and second portion of the                            duodenum were normal. Complications:            No immediate complications. Estimated Blood Loss:     Estimated blood loss was minimal. Impression:               - Normal esophagus.                           - Two non-bleeding gastric ulcers with no stigmata                            of bleeding. Biopsied.                           - Non-bleeding erosive gastropathy. Biopsied.                           - Normal duodenal bulb and second portion of the                            duodenum. Recommendation:           - Patient has a contact number available for                            emergencies. The signs and symptoms of potential                            delayed complications were discussed with the                            patient. Return to normal activities tomorrow.                            Written discharge instructions were provided to the                            patient.                            - Resume previous diet.                           - Continue present medications.                           - Await pathology results.                           - No aspirin, ibuprofen, naproxen, or other                            non-steroidal anti-inflammatory drugs.                           -  Return to GI office in 2 months. Ladene Artist, MD 07/07/2017 2:45:05 PM This report has been signed electronically.

## 2017-07-07 NOTE — Progress Notes (Signed)
Called to room to assist during endoscopic procedure.  Patient ID and intended procedure confirmed with present staff. Received instructions for my participation in the procedure from the performing physician.  

## 2017-07-07 NOTE — Patient Instructions (Addendum)
**   Handouts given on polyps, diverticulosis, and gastritis ** Avoid NSAIDS for two weeks!! Tylenol is okay!   * MAKE APPT TO SEE ME IN THE OFFICE IN 2 MONTHS *  YOU HAD AN ENDOSCOPIC PROCEDURE TODAY AT THE [facility]:   Refer to the procedure report that was given to you for any specific questions about what was found during the examination.  If the procedure report does not answer your questions, please call your gastroenterologist to clarify.  If you requested that your care partner not be given the details of your procedure findings, then the procedure report has been included in a sealed envelope for you to review at your convenience later.  YOU SHOULD EXPECT: Some feelings of bloating in the abdomen. Passage of more gas than usual.  Walking can help get rid of the air that was put into your GI tract during the procedure and reduce the bloating. If you had a lower endoscopy (such as a colonoscopy or flexible sigmoidoscopy) you may notice spotting of blood in your stool or on the toilet paper. If you underwent a bowel prep for your procedure, you may not have a normal bowel movement for a few days.  Please Note:  You might notice some irritation and congestion in your nose or some drainage.  This is from the oxygen used during your procedure.  There is no need for concern and it should clear up in a day or so.  SYMPTOMS TO REPORT IMMEDIATELY:   Following lower endoscopy (colonoscopy or flexible sigmoidoscopy):  Excessive amounts of blood in the stool  Significant tenderness or worsening of abdominal pains  Swelling of the abdomen that is new, acute  Fever of 100F or higher  For urgent or emergent issues, a gastroenterologist can be reached at any hour by calling [phone].   DIET:  We do recommend a small meal at first, but then you may proceed to your regular diet.  Drink plenty of fluids but you should avoid alcoholic beverages for 24 hours.  ACTIVITY:  You should  plan to take it easy for the rest of today and you should NOT DRIVE or use heavy machinery until tomorrow (because of the sedation medicines used during the test).    FOLLOW UP: Our staff will call the number listed on your records the next business day following your procedure to check on you and address any questions or concerns that you may have regarding the information given to you following your procedure. If we do not reach you, we will leave a message.  However, if you are feeling well and you are not experiencing any problems, there is no need to return our call.  We will assume that you have returned to your regular daily activities without incident.  If any biopsies were taken you will be contacted by phone or by letter within the next 1-3 weeks.  Please call us at [phone] if you have not heard about the biopsies in 3 weeks.    SIGNATURES/CONFIDENTIALITY: You and/or your care partner have signed paperwork which will be entered into your electronic medical record.  These signatures attest to the fact that that the information above on your After Visit Summary has been reviewed and is understood.  Full responsibility of the confidentiality of this discharge information lies with you and/or your care-partner.

## 2017-07-07 NOTE — Op Note (Signed)
[facility] Patient Name: Ralph Figueroa Procedure Date: 07/07/2017 2:01 PM MRN: [id number] Endoscopist: Ladene Artist , MD Age: 56 Referring MD:  Date of Birth: [birth year] Gender: Male Account #: [id number] Procedure:                Colonoscopy Indications:              Screening for colorectal malignant neoplasm Medicines:                Monitored Anesthesia Care Procedure:                Pre-Anesthesia Assessment:                           - Prior to the procedure, a History and Physical                            was performed, and patient medications and                            allergies were reviewed. The patient's tolerance of                            previous anesthesia was also reviewed. The risks                            and benefits of the procedure and the sedation                            options and risks were discussed with the patient.                            All questions were answered, and informed consent                            was obtained. Prior Anticoagulants: The patient has                            taken no previous anticoagulant or antiplatelet                            agents. ASA Grade Assessment: II - A patient with                            mild systemic disease. After reviewing the risks                            and benefits, the patient was deemed in                            satisfactory condition to undergo the procedure.                           After obtaining informed consent, the colonoscope  was passed under direct vision. Throughout the                            procedure, the patient's blood pressure, pulse, and                            oxygen saturations were monitored continuously. The                            Model PCF-[id number]) scope was introduced                            through the anus and advanced to the the cecum,                            identified by  appendiceal orifice and ileocecal                            valve. The ileocecal valve, appendiceal orifice,                            and rectum were photographed. The quality of the                            bowel preparation was good after extensive lavage                            and suctioning. The colonoscopy was performed                            without difficulty. The patient tolerated the                            procedure well. Scope In: 2:05:57 PM Scope Out: 2:24:34 PM Scope Withdrawal Time: 0 hours 16 minutes 23 seconds  Total Procedure Duration: 0 hours 18 minutes 37 seconds  Findings:                 The perianal and digital rectal examinations were                            normal.                           A 12 mm polyp was found in the cecum. The polyp was                            sessile. The polyp was removed with a piecemeal                            technique using a hot snare. Edges of the polyp                            were removed with a hot biopsy forceps. Resection  and retrieval were complete.                           One 6 mm submucosal nodule was found appendiceal                            orifice adjancent to the polyp.                           A 6 mm polyp was found in the sigmoid colon. The                            polyp was sessile. The polyp was removed with a                            cold snare. Resection and retrieval were complete.                           Multiple small-mouthed diverticula were found in                            the left colon. There was narrowing of the colon in                            association with the diverticular opening.                            Peri-diverticular erythema was seen. There was no                            evidence of diverticular bleeding.                           The exam was otherwise without abnormality on                            direct and  retroflexion views. Complications:            No immediate complications. Estimated blood loss:                            None. Estimated Blood Loss:     Estimated blood loss: none. Impression:               - One 12 mm polyp in the cecum, removed piecemeal                            using a hot snare. Resected and retrieved.                           - One submucosal nodule in the cecum                           - One 6 mm polyp in the sigmoid colon, removed with  a cold snare. Resected and retrieved.                           - Mild diverticulosis in the left colon. There was                            narrowing of the colon in association with the                            diverticular opening. Peri-diverticular erythema                            was seen. There was no evidence of diverticular                            bleeding.                           - The examination was otherwise normal on direct                            and retroflexion views. Recommendation:           - Repeat colonoscopy in 1 year for surveillance                            after piecemeal polypectomy and path review.                           - Patient has a contact number available for                            emergencies. The signs and symptoms of potential                            delayed complications were discussed with the                            patient. Return to normal activities tomorrow.                            Written discharge instructions were provided to the                            patient.                           - Resume previous diet.                           - Continue present medications.                           - Await pathology results.                           - No  aspirin, ibuprofen, naproxen, or other                            non-steroidal anti-inflammatory drugs for 2 weeks                            after polyp removal. Ladene Artist, MD 07/07/2017 2:38:19 PM This report has been signed electronically.

## 2017-07-08 ENCOUNTER — Telehealth: Payer: Self-pay | Admitting: *Deleted

## 2017-07-08 NOTE — Telephone Encounter (Signed)
  Follow up Call-  Call back number 07/07/2017  Post procedure Call Back phone  # [phone]  Permission to leave phone message Yes  Some recent data might be hidden   [facility]

## 2017-07-08 NOTE — Telephone Encounter (Signed)
  Follow up Call-  Call back number 07/07/2017  Post procedure Call Back phone  # [phone]  Permission to leave phone message Yes  Some recent data might be hidden     Patient questions:  Phone doesn't eve ring. Correct number per Snapshot. Tried x 2.

## 2017-07-15 ENCOUNTER — Encounter: Payer: Self-pay | Admitting: Gastroenterology

## 2017-08-22 ENCOUNTER — Ambulatory Visit (INDEPENDENT_AMBULATORY_CARE_PROVIDER_SITE_OTHER): Payer: 59 | Admitting: Neurology

## 2017-08-22 ENCOUNTER — Encounter: Payer: Self-pay | Admitting: Neurology

## 2017-08-22 VITALS — BP 140/98 | HR 83 | Ht 72.0 in | Wt 224.8 lb

## 2017-08-22 DIAGNOSIS — R209 Unspecified disturbances of skin sensation: Secondary | ICD-10-CM

## 2017-08-22 NOTE — Patient Instructions (Signed)
Happy early retirement!   Come back and see in me in 4 months

## 2017-08-22 NOTE — Progress Notes (Signed)
[facility] Clinic Note - Initial Visit   Date: 08/22/17  Ralph Figueroa MRN: [id number] DOB: [birth year]   Dear Dr. Nani Ravens:  Thank you for your kind referral of Ralph Figueroa for consultation of disturbance of skin sensation. Although his history is well known to you, please allow [place] to reiterate it for the purpose of our medical record. The patient was accompanied to the clinic by self.   History of Present Illness: Ralph Figueroa is a 56 y.o. left-handed Caucasian male with migraines and GERD presenting for evaluation of disturbance of skin sensation.    Symptoms started summer 2018 with prickly sensation forearms, shoulders, hands, and thighs.  He compares it to a hair brush rolling on the skin.  Symptoms are intermittent and more noticeable only when at rest.  He is very active and does not notice it when staying busy.  It can last all day.  He is not aware of symptoms when he first wakes up, but has the day continues, he is more aware of it.  He is more aware of his clothing when he contacts his skin.  He has not changed his soap, detergent, or lotion.   He denies any neck pain, hand weakness, tingling, or numbness.  Initially, he was having RUQ pain and felt that symptoms started the same time, but his abdominal pain has improved.  Unfortunately, his skin sensation has not.  He also states that this past year has been extremely stressful as he is planning to retire at the end of the year.  He does feel that stress may be contributing to his discomfort.   Migraines started around the age of 56.  He has migraines a few times per month, which he treats with aspirin.  Pain is bitemporal and throbbing, lasting 3 days.  There is no associated with his headaches to his skin hypersensitivity.  Out-side paper records, electronic medical record, and images have been reviewed where available and summarized as:  Labs 05/23/2017: Vitamin B12 492, folate 9.6, ESR 10, TSH  1.36   Past Medical History:  Diagnosis Date  . Gastric ulcer   . Migraines   . Seasonal allergies   . Tubular adenoma of colon 07/2017    Past Surgical History:  Procedure Laterality Date  . HYDROCELE EXCISION    . TIBIA FRACTURE SURGERY Right    and fibula  . WISDOM TOOTH EXTRACTION       Medications:  Outpatient Encounter Medications as of 08/22/2017  Medication Sig  . acetaminophen (TYLENOL) 500 MG tablet Take 500 mg by mouth every 6 (six) hours as needed.  Marland Kitchen aspirin EC 325 MG tablet Take 1 tablet (325 mg total) by mouth daily.  Marland Kitchen ibuprofen (ADVIL,MOTRIN) 200 MG tablet Take 200 mg by mouth every 6 (six) hours as needed.  . Na Sulfate-K Sulfate-Mg Sulf 17.5-3.13-1.6 GM/180ML SOLN Take as directed for colonoscopy.  . pantoprazole (PROTONIX) 40 MG tablet Take 1 tab by mouth every morning.   No facility-administered encounter medications on file as of 08/22/2017.      Allergies:  Allergies  Allergen Reactions  . Shellfish Allergy Rash    Family History: Family History  Adopted: Yes  Problem Relation Age of Onset  . Cancer Neg Hx     Social History: Social History   Tobacco Use  . Smoking status: Former Smoker    Last attempt to quit: 1988    Years since quitting: 30.9  . Smokeless tobacco: Never Used  Substance Use Topics  . Alcohol use: Yes    Comment: weekends  . Drug use: No   Social History   Social History Narrative   Married, lives in a 2 story home. Drinks 3-4 cups of coffee a day. No regular exercise recently. He is preparing to retire.    Review of Systems:  CONSTITUTIONAL: No fevers, chills, night sweats, or weight loss.   EYES: No visual changes or eye pain ENT: No hearing changes.  No history of nose bleeds.   RESPIRATORY: No cough, wheezing and shortness of breath.   CARDIOVASCULAR: Negative for chest pain, and palpitations.   GI: Negative for abdominal discomfort, blood in stools or black stools.  No recent change in bowel habits.     GU:  No history of incontinence.   MUSCLOSKELETAL: No history of joint pain or swelling.  No myalgias.   SKIN: Negative for lesions, rash, and itching.   HEMATOLOGY/ONCOLOGY: Negative for prolonged bleeding, bruising easily, and swollen nodes.  No history of cancer.   ENDOCRINE: Negative for cold or heat intolerance, polydipsia or goiter.   PSYCH:  No depression or anxiety symptoms.   NEURO: As Above.   Vital Signs:  BP (!) 140/98   Pulse 83   Ht 6' (1.829 m)   Wt 224 lb 12.8 oz (102 kg)   SpO2 96%   BMI 30.49 kg/m  Pain Scale: 0 on a scale of 0-10   General Medical Exam:   General:  Well appearing, comfortable.   Eyes/ENT: see cranial nerve examination.   Neck: No masses appreciated.  Full range of motion without tenderness.  No carotid bruits. Respiratory:  Clear to auscultation, good air entry bilaterally.   Cardiac:  Regular rate and rhythm, no murmur.   Extremities:  No deformities, edema, or skin discoloration.  Skin:  No rashes or lesions.  Neurological Exam: MENTAL STATUS including orientation to time, place, person, recent and remote memory, attention span and concentration, language, and fund of knowledge is normal.  Speech is not dysarthric.  CRANIAL NERVES: II:  No visual field defects.  Unremarkable fundi.   III-IV-VI: Pupils equal round and reactive to light.  Normal conjugate, extra-ocular eye movements in all directions of gaze.  No nystagmus.  No ptosis.   V:  Normal facial sensation.     VII:  Normal facial symmetry and movements.    VIII:  Normal hearing and vestibular function.   IX-X:  Normal palatal movement.   XI:  Normal shoulder shrug and head rotation.   XII:  Normal tongue strength and range of motion, no deviation or fasciculation.  MOTOR: Motor strength is 5/5 throughout.   No atrophy, fasciculations or abnormal movements.  No pronator drift.  Tone is normal.    MSRs:  Right                                                                  Left brachioradialis 2+  brachioradialis 2+  biceps 2+  biceps 2+  triceps 2+  triceps 2+  patellar 2+  patellar 2+  ankle jerk 2+  ankle jerk 2+  Hoffman no  Hoffman no  plantar response down  plantar response down   SENSORY:  Normal and symmetric perception of light touch, pinprick,  vibration, and proprioception.  Romberg's sign absent.   COORDINATION/GAIT: Normal finger-to- nose-finger and heel-to-shin.  Intact rapid alternating movements bilaterally.  Able to rise from a chair without using arms.  Gait narrow based and stable. Tandem and stressed gait intact.    IMPRESSION: Mr. Hagood is a 56 year-old man referred for evaluation of disturbance in skin sensation.  His neurological exam is intact and nonfocal.  Symptoms do not confirmed her cutaneous nerve or dermatome distribution.  He denies any numbness, tingling, burning, or weakness and has more of a prickly sensation.  He endorses that the past year has been very stressful as he prepares to complete various projects before he retires at the end of the year. He further reports having a gastric ulcer this summer in the setting of his high stress.  I explained that with his diffuse and intermittent symptoms, it is very possible that his sensory complains are stress-induced, especially given normal TSH, vitamin B12, folate, and exam.  He is very excited about his upcoming retirement and would like to observe his symptoms for the next few months prior to any additional testing, which I absolutely agree with.  If he develops new or worsening symptoms, the next step is electrodiagnostic testing.  Return to clinic in 4 months  Thank you for allowing me to participate in patient's care.  If I can answer any additional questions, I would be pleased to do so.    Sincerely,    Ennio Houp K. Posey Pronto, DO

## 2017-09-01 ENCOUNTER — Ambulatory Visit: Payer: 59 | Admitting: Neurology

## 2017-09-09 ENCOUNTER — Encounter: Payer: Self-pay | Admitting: Gastroenterology

## 2017-09-09 ENCOUNTER — Ambulatory Visit (INDEPENDENT_AMBULATORY_CARE_PROVIDER_SITE_OTHER): Payer: BLUE CROSS/BLUE SHIELD | Admitting: Gastroenterology

## 2017-09-09 VITALS — BP 140/82 | HR 74 | Ht 72.0 in | Wt 225.1 lb

## 2017-09-09 DIAGNOSIS — K253 Acute gastric ulcer without hemorrhage or perforation: Secondary | ICD-10-CM

## 2017-09-09 DIAGNOSIS — Z86010 Personal history of colonic polyps: Secondary | ICD-10-CM

## 2017-09-09 MED ORDER — PANTOPRAZOLE SODIUM 40 MG PO TBEC: DELAYED_RELEASE_TABLET | ORAL | 3 refills | Status: DC

## 2017-09-09 NOTE — Patient Instructions (Signed)
We have sent the following medications to your pharmacy for you to pick up at your convenience: pantoprazole.   Normal BMI (Body Mass Index- based on height and weight) is between 19 and 25. Your BMI today is Body mass index is 30.53 kg/m. Marland Kitchen Please consider follow up  regarding your BMI with your Primary Care Provider.   Thank you for choosing me and [facility].  Pricilla Riffle. Dagoberto Ligas., MD., Marval Regal

## 2017-09-09 NOTE — Progress Notes (Signed)
    History of Present Illness: This is a 57 year old male returning for follow-up of gastric ulcers. He relates resolution of his RUQ/epigastric abdominal pain.  He discontinued NSAID usage and is taking pantoprazole daily as prescribed.  He has no gastrointestinal complaints.  He relates that he just retired and he is very happy about this change.  EGD 07/2017 - Normal esophagus. - Two non-bleeding gastric antral ulcers with no stigmata of bleeding. Biopsied. - Non-bleeding erosive gastropathy. Biopsied. (reactive changes negative for HP) - Normal duodenal bulb and second portion of the duodenum.  Colonoscopy 07/2017 - One 12 mm polyp in the cecum, removed piecemeal using a hot snare. Resected and Retrieved. (tubular adenoma) - One submucosal nodule in the cecum - One 6 mm polyp in the sigmoid colon, removed with a cold snare. Resected and retrieved. - Mild diverticulosis in the left colon. There was narrowing of the colon in association with the diverticular opening. Peri-diverticular erythema was seen. There was no evidence of diverticular bleeding. - The examination was otherwise normal on direct and retroflexion views.  Current Medications, Allergies, Past Medical History, Past Surgical History, Family History and Social History were reviewed in [employer] record.  Physical Exam: General: Well developed, well nourished, no acute distress Head: Normocephalic and atraumatic Eyes:  sclerae anicteric, EOMI Ears: Normal auditory acuity Mouth: No deformity or lesions Lungs: Clear throughout to auscultation Heart: Regular rate and rhythm; no murmurs, rubs or bruits Abdomen: Soft, non tender and non distended. No masses, hepatosplenomegaly or hernias noted. Normal Bowel sounds Rectal: not done Musculoskeletal: Symmetrical with no gross deformities  Pulses:  Normal pulses noted Extremities: No clubbing, cyanosis, edema or deformities noted Neurological: Alert  oriented x 4, grossly nonfocal Psychological:  Alert and cooperative. Normal mood and affect  Assessment and Recommendations:  1. Small gastric antral ulcers.  Continue pantoprazole 40 mg daily.  Avoid, or at least minimize, the use of aspirin and NSAIDs. REV in 1 year.   2.  Personal history of adenomatous colon polyp removed piecemeal.  Colonoscopy at 1 year in November 2019 to determine if the piecemeal polyp was completely removed.

## 2017-10-13 ENCOUNTER — Encounter: Payer: Self-pay | Admitting: Family Medicine

## 2017-10-13 ENCOUNTER — Ambulatory Visit (INDEPENDENT_AMBULATORY_CARE_PROVIDER_SITE_OTHER): Payer: BLUE CROSS/BLUE SHIELD | Admitting: Family Medicine

## 2017-10-13 VITALS — BP 130/80 | HR 74 | Temp 98.0°F | Ht 72.0 in | Wt 225.0 lb

## 2017-10-13 DIAGNOSIS — R109 Unspecified abdominal pain: Secondary | ICD-10-CM | POA: Diagnosis not present

## 2017-10-13 MED ORDER — AMITRIPTYLINE HCL 50 MG PO TABS: 50.0000 mg | ORAL_TABLET | Freq: Every day | ORAL | 2 refills | Status: DC

## 2017-10-13 NOTE — Progress Notes (Signed)
Chief Complaint  Patient presents with  . Abdominal Pain    R side abd pain. Onset about 6 months ago. Pt states that he's been seen for it but has been resolved. Pt states he has ulcers and Pt also states that he found a tick on him during the summer and did some cleaning (mouse droppings) and states about 2 months afterwards he began to feel sick. States when he's active he feels great but when he's resting he doesn't feel well.   . Skin Problem    Pt states his skin feels "prickly"     Ralph Figueroa is here for R sided abdominal pain.  Duration: 6 months  He does follow with the GI team and was found to have 2 non-bleeding gastric ulcers during endoscopy. Nighttime awakenings? No  Still having daily R sided pain Bleeding? No Weight loss? No  Palliation: Working out; BM, marijuana Provocation: sedentary  Associated symptoms: nausea Denies: fever and vomiting Treatment to date: Protonix  ROS: Constitutional: No fevers GI: No V/D/C, no bleeding + pain  Past Medical History:  Diagnosis Date  . Gastric ulcer   . Migraines   . Seasonal allergies   . Tubular adenoma of colon 07/2017   Family History  Adopted: Yes  Problem Relation Age of Onset  . Cancer Neg Hx    Past Surgical History:  Procedure Laterality Date  . HYDROCELE EXCISION    . TIBIA FRACTURE SURGERY Right    and fibula  . WISDOM TOOTH EXTRACTION      BP 130/80   Pulse 74   Temp 98 F (36.7 C) (Oral)   Ht 6' (1.829 m)   Wt 225 lb (102.1 kg)   SpO2 98%   BMI 30.52 kg/m  Gen.: Awake, alert, appears stated age 16: Mucous membranes moist without mucosal lesions Heart: Regular rate and rhythm without murmurs Lungs: Clear auscultation bilaterally, no rales or wheezing, normal effort without accessory muscle use. Abdomen: Bowel sounds are present. Abdomen is soft, mild discomfort over R abd region, nondistended, no masses or organomegaly. Negative Murphy's, Rovsing's, McBurney's, and Carnett's  sign. Psych: Age appropriate judgment and insight. Normal mood and affect.  Functional abdominal pain syndrome - Plan: amitriptyline (ELAVIL) 50 MG tablet  Orders as above. Pain had improved from dx of ulcer, now getting worse again. No red flag s/s's.  Neg colonoscopy, RUQ [place].  F/u in 4-6 weeks. Pt voiced understanding and agreement to the plan.  Greater than 25 minutes were spent face to face with the patient with greater than 50% of this time spent counseling on abd pain, diet, CBD oil, and follow up.   [place], DO 10/13/17 9:38 AM

## 2017-10-13 NOTE — Patient Instructions (Addendum)
Consider CBD oil for your pain. I am thinking irritable bowel syndrome (IBS) is a strong possibly.  Consider keeping a food log to see what things cause you pain or relief.  Try out the FODMAP plan.  Let us know if you need anything.

## 2017-11-05 ENCOUNTER — Other Ambulatory Visit: Payer: Self-pay | Admitting: Family Medicine

## 2017-11-05 DIAGNOSIS — R1011 Right upper quadrant pain: Secondary | ICD-10-CM

## 2017-11-05 MED ORDER — DICYCLOMINE HCL 20 MG PO TABS: 20.0000 mg | ORAL_TABLET | Freq: Three times a day (TID) | ORAL | 3 refills | Status: DC

## 2017-11-10 ENCOUNTER — Telehealth: Payer: Self-pay | Admitting: Family Medicine

## 2017-11-10 NOTE — Telephone Encounter (Signed)
Copied from [place] [id number]. Topic: Quick Communication - See Telephone Encounter >> Nov 10, 2017  9:04 AM Cleaster Corin, NT wrote: CRM for notification. See Telephone encounter for:   11/10/17. Pt. Calling to ask nurse or doctor which med. Does he need to take . He was on was using amitriptyline (ELAVIL) 50 MG tablet. Then dicyclomine (BENTYL) 20 MG tablet was sent to the pharmacy. The pt. Is wondering is this do the same thing as the1st med. Or its it for depression and not ivs. Pt. Just want to make sure that he is taking the correct med. Med. [facility] to [facility] [place], [place] [address] Phone: [phone] Fax: [phone]

## 2017-11-10 NOTE — Telephone Encounter (Signed)
Patient was informed of medication clarification. States verbal understanding/appreciated the call back and to let PCP know he is feeling 100% better.

## 2017-11-10 NOTE — Telephone Encounter (Signed)
Elavil is an antidepressant that is also used for IBS. Bentyl is an antispasmodic (helps with cramping of the bowel) that can be used for symptom control in IBS and other issues. I recommend continuing Elavil if helpful, use Bentyl on an as needed basis. TY.

## 2017-11-24 ENCOUNTER — Ambulatory Visit (INDEPENDENT_AMBULATORY_CARE_PROVIDER_SITE_OTHER): Payer: BLUE CROSS/BLUE SHIELD | Admitting: Family Medicine

## 2017-11-24 ENCOUNTER — Encounter: Payer: Self-pay | Admitting: Family Medicine

## 2017-11-24 VITALS — BP 132/84 | HR 91 | Temp 98.1°F | Ht 72.0 in | Wt 211.5 lb

## 2017-11-24 DIAGNOSIS — K589 Irritable bowel syndrome without diarrhea: Secondary | ICD-10-CM | POA: Diagnosis not present

## 2017-11-24 NOTE — Progress Notes (Signed)
Chief Complaint  Patient presents with  . Follow-up    Subjective: Patient is a 57 y.o. male here for IBS f/u.  Started Elavil 50 mg/d and Bentyl prn, doing well with both. States he is 100% better, no AE's. Takes daily.   ROS: Abd: No pain   Past Medical History:  Diagnosis Date  . Gastric ulcer   . Migraines   . Seasonal allergies   . Tubular adenoma of colon 07/2017   Objective: BP 132/84 (BP Location: Left Arm, Patient Position: Sitting, Cuff Size: Large)   Pulse 91   Temp 98.1 F (36.7 C) (Oral)   Ht 6' (1.829 m)   Wt 211 lb 8 oz (95.9 kg)   SpO2 98%   BMI 28.68 kg/m  General: Awake, appears stated age Heart: RRR, no murmurs Lungs: CTAB, no rales, wheezes or rhonchi. No accessory muscle use Abd: BS+, soft, NT, ND, no masses or organomegaly Psych: Age appropriate judgment and insight, normal affect and mood  Assessment and Plan: Irritable bowel syndrome, unspecified type  Continue Elavil and Bentyl prn. He is doing very well with weight loss. F/u in 6 mo for CPE.  The patient voiced understanding and agreement to the plan.  [place], DO 11/24/17  9:00 AM

## 2017-11-24 NOTE — Patient Instructions (Signed)
Keep up the good work!  Let us know when you need refills.

## 2017-11-24 NOTE — Progress Notes (Signed)
Pre visit review using our clinic review tool, if applicable. No additional management support is needed unless otherwise documented below in the visit note. 

## 2017-12-29 ENCOUNTER — Ambulatory Visit: Payer: 59 | Admitting: Neurology

## 2018-01-23 ENCOUNTER — Other Ambulatory Visit: Payer: Self-pay | Admitting: Family Medicine

## 2018-01-23 DIAGNOSIS — R109 Unspecified abdominal pain: Secondary | ICD-10-CM

## 2018-08-17 ENCOUNTER — Other Ambulatory Visit: Payer: Self-pay

## 2018-08-17 MED ORDER — PANTOPRAZOLE SODIUM 40 MG PO TBEC: DELAYED_RELEASE_TABLET | ORAL | 0 refills | Status: DC

## 2018-08-18 ENCOUNTER — Other Ambulatory Visit: Payer: Self-pay | Admitting: Family Medicine

## 2018-08-18 DIAGNOSIS — R109 Unspecified abdominal pain: Secondary | ICD-10-CM

## 2018-08-19 MED ORDER — AMITRIPTYLINE HCL 50 MG PO TABS: 50.0000 mg | ORAL_TABLET | Freq: Every day | ORAL | 2 refills | Status: DC

## 2018-09-06 ENCOUNTER — Encounter: Payer: Self-pay | Admitting: Gastroenterology

## 2018-12-08 IMAGING — US US ABDOMEN LIMITED
1 series · 14 of 25 positions shown · non-contrast
Comparison: None.

CLINICAL DATA: Initial evaluation for acute postprandial right
upper quadrant pain for 3 weeks with nausea.

EXAM:
ULTRASOUND ABDOMEN LIMITED RIGHT UPPER QUADRANT

[Series 1: us abdomen limited · 0.17mm/px · 14 of 42 slices shown]
[im 1/42]
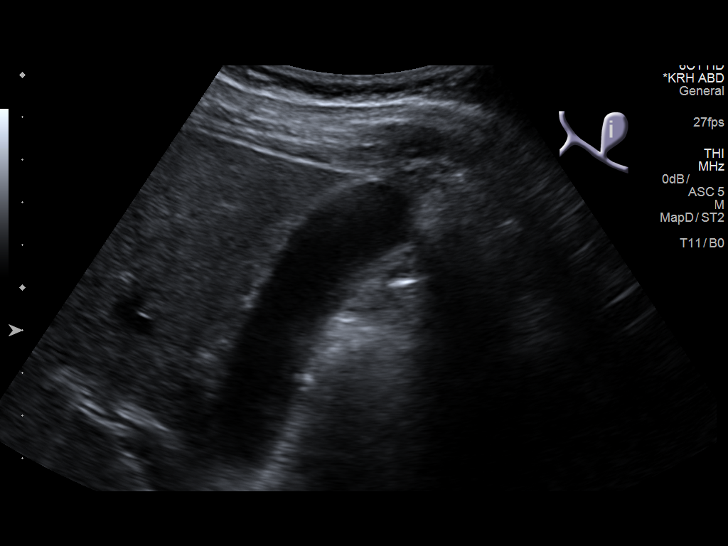
[im 4/42]
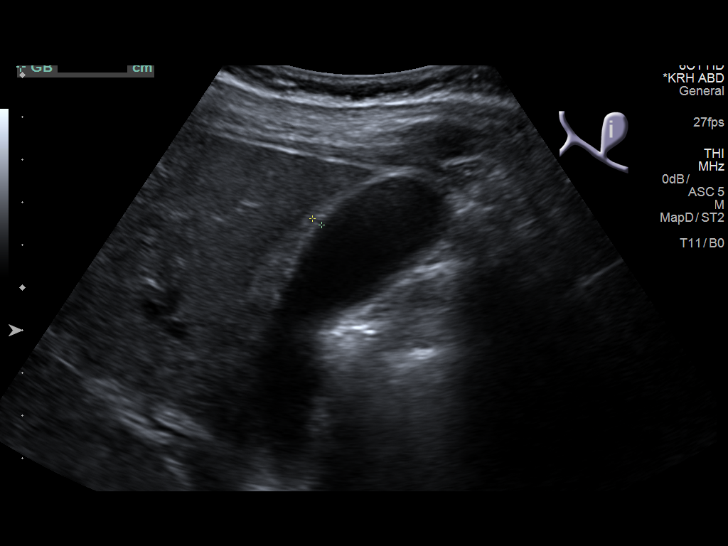
[im 7/42]
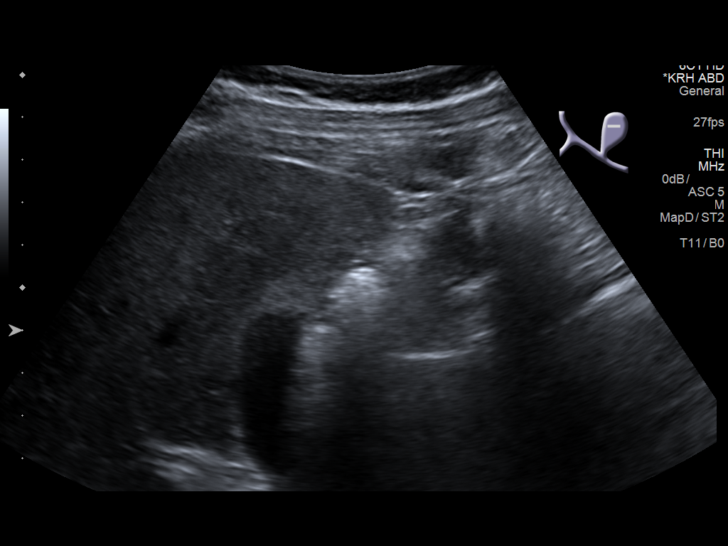
[im 11/42]
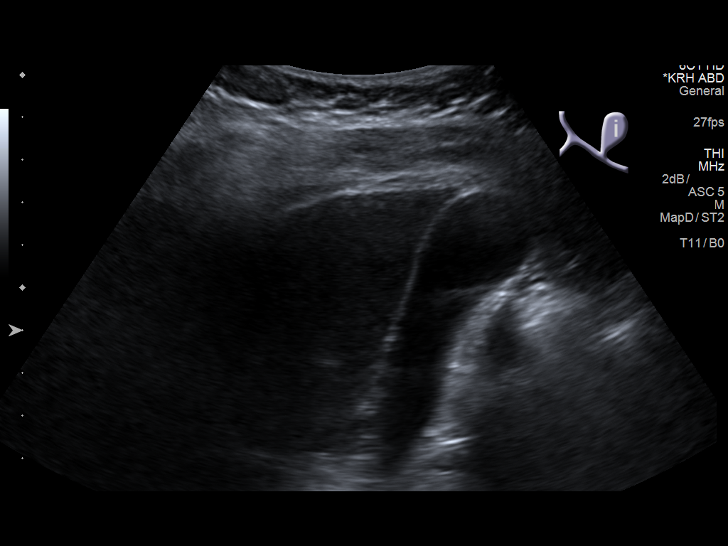
[im 14/42]
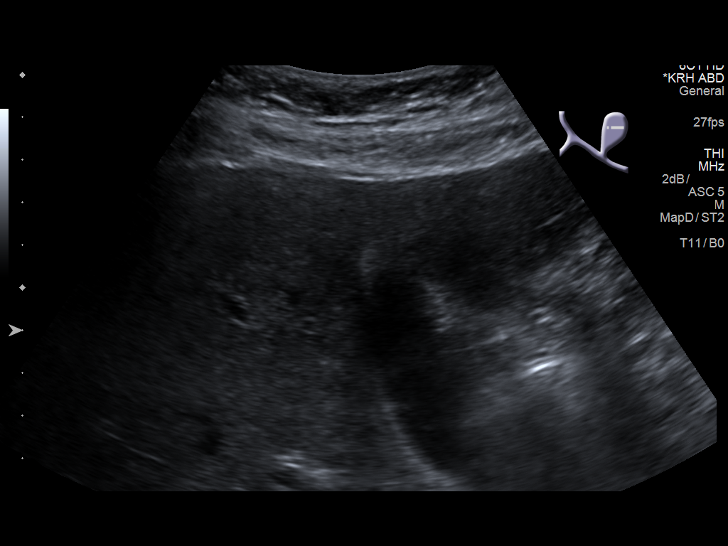
[im 16/42]
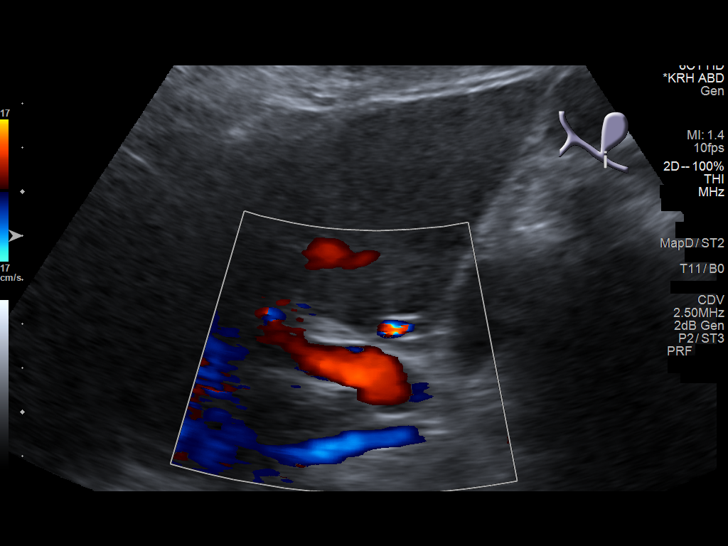
[im 19/42]
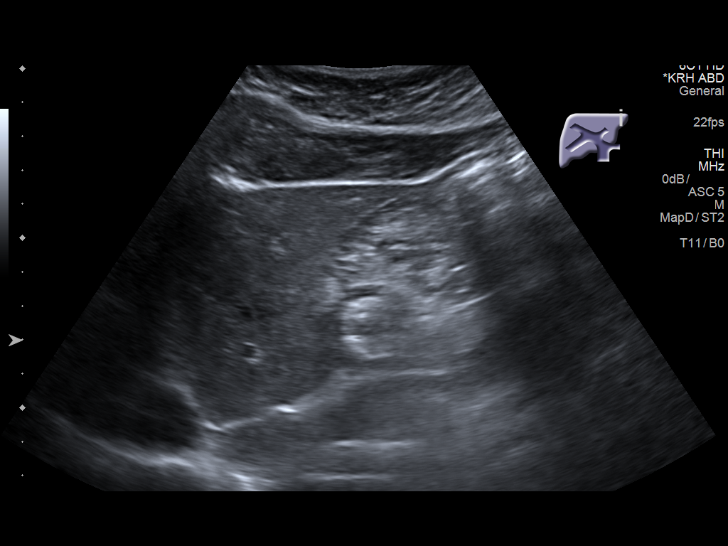
[im 23/42]
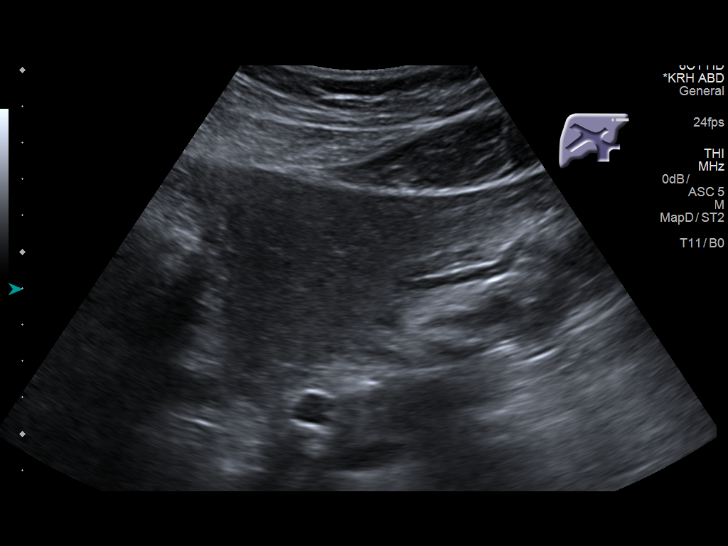
[im 26/42]
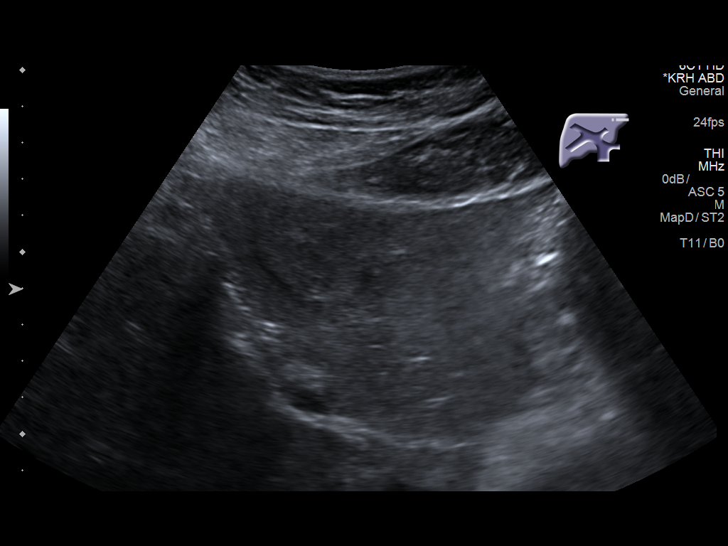
[im 28/42]
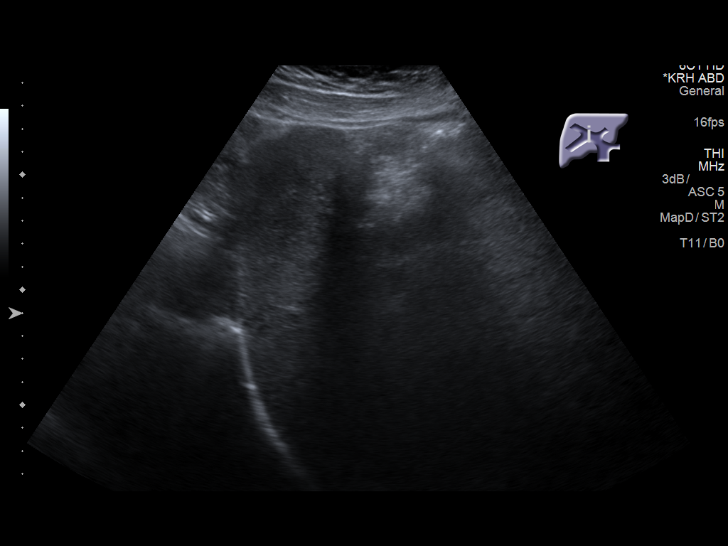
[im 31/42]
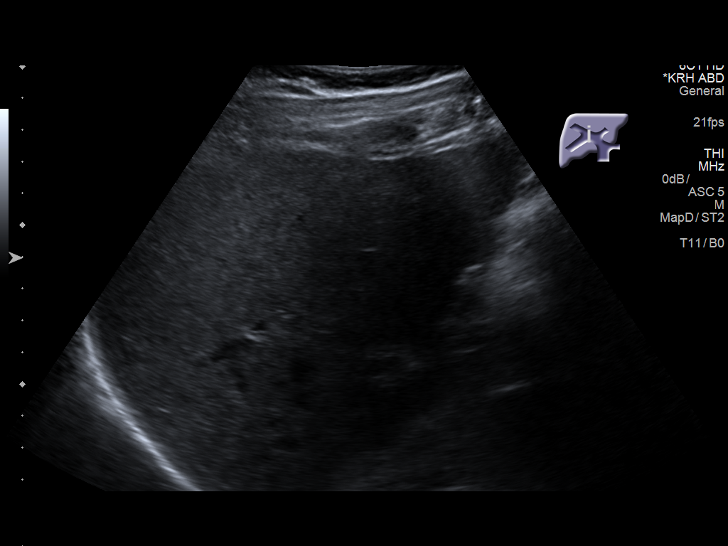
[im 35/42]
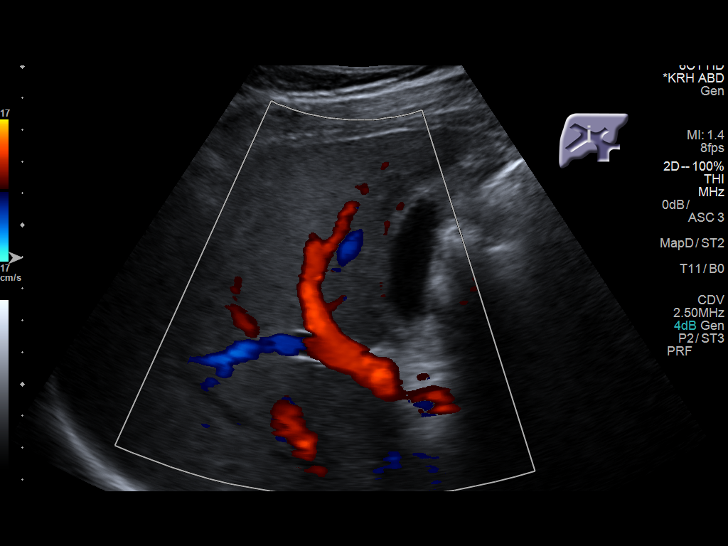
[im 38/42]
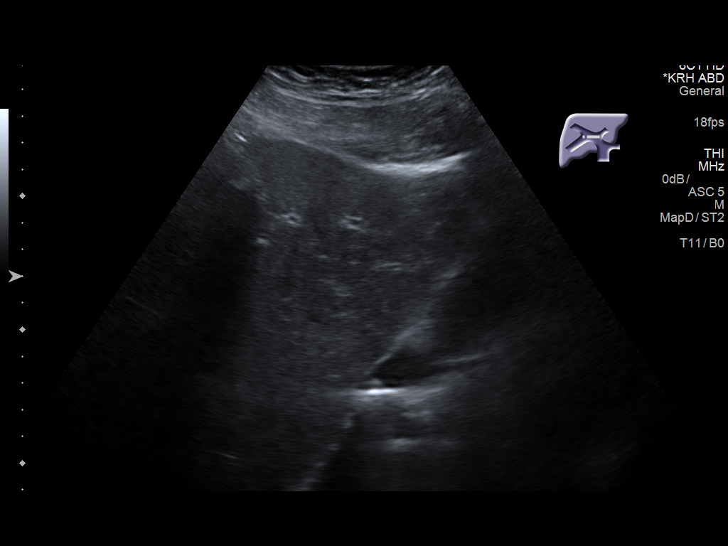
[im 42/42]
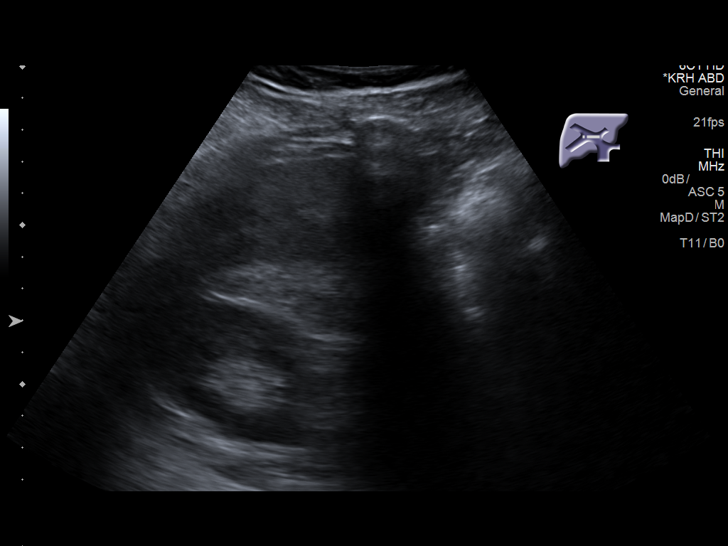

[14 of 25 positions shown; findings below may reference images not displayed]

FINDINGS: Gallbladder:

No gallstones or wall thickening visualized. No sonographic Murphy
sign noted by sonographer.

Common bile duct:

Diameter: 2.4 mm

Liver:

No focal lesion identified. Within normal limits in parenchymal
echogenicity.
IMPRESSION: Normal right upper quadrant ultrasound. No sonographic evidence for
cholelithiasis, acute cholecystitis, or biliary dilatation.

## 2020-05-14 DIAGNOSIS — Z20822 Contact with and (suspected) exposure to covid-19: Secondary | ICD-10-CM | POA: Diagnosis not present

## 2020-08-01 ENCOUNTER — Encounter: Payer: Self-pay | Admitting: Family Medicine

## 2020-08-01 ENCOUNTER — Ambulatory Visit (INDEPENDENT_AMBULATORY_CARE_PROVIDER_SITE_OTHER): Payer: BC Managed Care – PPO | Admitting: Family Medicine

## 2020-08-01 ENCOUNTER — Other Ambulatory Visit: Payer: Self-pay

## 2020-08-01 VITALS — BP 128/68 | HR 88 | Temp 98.4°F | Ht 72.0 in | Wt 231.0 lb

## 2020-08-01 DIAGNOSIS — Z125 Encounter for screening for malignant neoplasm of prostate: Secondary | ICD-10-CM | POA: Diagnosis not present

## 2020-08-01 DIAGNOSIS — E785 Hyperlipidemia, unspecified: Secondary | ICD-10-CM | POA: Diagnosis not present

## 2020-08-01 DIAGNOSIS — Z0000 Encounter for general adult medical examination without abnormal findings: Secondary | ICD-10-CM

## 2020-08-01 LAB — COMPREHENSIVE METABOLIC PANEL
ALT: 19 U/L (ref 0–53)
AST: 21 U/L (ref 0–37)
Albumin: 4.7 g/dL (ref 3.5–5.2)
Alkaline Phosphatase: 54 U/L (ref 39–117)
BUN: 16 mg/dL (ref 6–23)
CO2: 29 mEq/L (ref 19–32)
Calcium: 9.5 mg/dL (ref 8.4–10.5)
Chloride: 100 mEq/L (ref 96–112)
Creatinine, Ser: 0.91 mg/dL (ref 0.40–1.50)
GFR: 91.06 mL/min (ref >60.00)
Glucose, Bld: 103 mg/dL — ABNORMAL HIGH (ref 70–99)
Potassium: 5 mEq/L (ref 3.5–5.1)
Sodium: 137 mEq/L (ref 135–145)
Total Bilirubin: 0.5 mg/dL (ref 0.2–1.2)
Total Protein: 7.7 g/dL (ref 6.0–8.3)

## 2020-08-01 LAB — PSA: PSA: 0.92 ng/mL (ref 0.10–4.00)

## 2020-08-01 LAB — LIPID PANEL
Cholesterol: 270 mg/dL — ABNORMAL HIGH (ref 0–200)
HDL: 60.7 mg/dL (ref >39.00)
LDL Cholesterol: 174 mg/dL — ABNORMAL HIGH (ref 0–99)
NonHDL: 209.22
Total CHOL/HDL Ratio: 4
Triglycerides: 177 mg/dL — ABNORMAL HIGH (ref 0.0–149.0)
VLDL: 35.4 mg/dL (ref 0.0–40.0)

## 2020-08-01 NOTE — Progress Notes (Signed)
Chief Complaint  Patient presents with  . Annual Exam    Well Male WELCOME Ralph Figueroa is here for a complete physical.   His last physical was >1 year ago.  Current diet: in general, diet could be better.  Current exercise: weight lifting, cardio Weight trend: gained some wt Fatigue out of ordinary? No. Seat belt? Yes.    Health maintenance Shingrix- Yes  Colonoscopy- Yes Tetanus- Yes HIV- Yes Hep C- Yes   Past Medical History:  Diagnosis Date  . Gastric ulcer   . Migraines   . Seasonal allergies   . Tubular adenoma of colon 07/2017      Past Surgical History:  Procedure Laterality Date  . HYDROCELE EXCISION    . TIBIA FRACTURE SURGERY Right    and fibula  . WISDOM TOOTH EXTRACTION      Medications  Current Outpatient Medications on File Prior to Visit  Medication Sig Dispense Refill  . acetaminophen (TYLENOL) 500 MG tablet Take 500 mg by mouth every 6 (six) hours as needed.    . pantoprazole (PROTONIX) 40 MG tablet Take 1 tab by mouth every morning. NEEDS APPT FOR FURTHER REFILLS 90 tablet 0    Allergies Allergies  Allergen Reactions  . Shellfish Allergy Rash    Family History Family History  Adopted: Yes  Problem Relation Age of Onset  . Cancer Neg Hx     Review of Systems: Constitutional:  no fevers Eye:  no recent significant change in vision Ear/Nose/Mouth/Throat:  Ears:  no hearing loss Nose/Mouth/Throat:  no complaints of nasal congestion, no sore throat Cardiovascular:  no chest pain Respiratory:  no shortness of breath Gastrointestinal:  no change in bowel habits GU:  Male: negative for dysuria, frequency Musculoskeletal/Extremities:  no joint pain Integumentary (Skin/Breast):  no abnormal skin lesions reported Neurologic:  no headaches Endocrine: No unexpected weight changes Hematologic/Lymphatic:  no abnormal bleeding  Exam BP 128/68 (BP Location: Left Arm, Patient Position: Sitting, Cuff Size: Normal)   Pulse 88   Temp 98.4 F (36.9  C) (Oral)   Ht 6' (1.829 m)   Wt 231 lb (104.8 kg)   SpO2 97%   BMI 31.33 kg/m  General:  well developed, well nourished, in no apparent distress Skin:  no significant moles, warts, or growths Head:  no masses, lesions, or tenderness Eyes:  pupils equal and round, sclera anicteric without injection Ears:  canals without lesions, TMs shiny without retraction, no obvious effusion, no erythema Nose:  nares patent, septum midline, mucosa normal Throat/Pharynx:  lips and gingiva without lesion; tongue and uvula midline; non-inflamed pharynx; no exudates or postnasal drainage Neck: neck supple without adenop+athy, thyromegaly, or masses Cardiac: RRR, no bruits, no LE edema Lungs:  clear to auscultation, breath sounds equal bilaterally, no respiratory distress Rectal: Deferred Musculoskeletal:  symmetrical muscle groups noted without atrophy or deformity Neuro:  gait normal; deep tendon reflexes normal and symmetric Psych: well oriented with normal range of affect and appropriate judgment/insight  Assessment and Plan  Well adult exam  Screening for prostate cancer - Plan: PSA  Hyperlipidemia, unspecified hyperlipidemia type - Plan: Lipid panel, Comprehensive metabolic panel   Well 59 y.o. male. Counseled on diet and exercise. Counseled on risks and benefits of prostate cancer screening with PSA. The patient agrees to undergo testing. Immunizations, labs, and further orders as above. Follow up in 1 yr pending above. The patient voiced understanding and agreement to the plan.  [place], DO 08/01/20 8:21 AM

## 2020-08-01 NOTE — Patient Instructions (Addendum)
Give [place] 2-3 business days to get the results of your labs back.   Keep the diet clean and stay active.  OK to use Debrox (peroxide) in the ear to loosen up wax. Also recommend using a bulb syringe (for removing boogers from baby's noses) to flush through warm water. Do not use Q-tips as this can impact wax further.  Let us know if you need anything.

## 2020-08-02 ENCOUNTER — Other Ambulatory Visit: Payer: Self-pay | Admitting: Family Medicine

## 2020-08-02 ENCOUNTER — Telehealth: Payer: Self-pay | Admitting: Family Medicine

## 2020-08-02 DIAGNOSIS — E785 Hyperlipidemia, unspecified: Secondary | ICD-10-CM

## 2020-08-02 MED ORDER — ROSUVASTATIN CALCIUM 20 MG PO TABS: 20.0000 mg | ORAL_TABLET | Freq: Every day | ORAL | 3 refills | Status: DC

## 2020-08-02 NOTE — Telephone Encounter (Signed)
Sent. Let me know if there are cost issues. Please schedule a lab visit in 6 weeks and order a lipid panel and hep function panel. I'd like to see him in the office in 6 months instead of 12 months to follow up on this. Ty.

## 2020-08-02 NOTE — Telephone Encounter (Signed)
Patient is calling back in regards to lab results he is agreeing to medication.  Ralph Figueroa at [place], [place] - [place]  [address] [place], [place] [address]  Phone:  [phone] Fax:  [phone]

## 2020-08-02 NOTE — Telephone Encounter (Signed)
He is agreeing to the medication

## 2020-08-02 NOTE — Telephone Encounter (Signed)
Patient informed medication sent in. Scheduled lab appt/ put in the order. Scheduled OV in 6 months The patient verbalized understanding.

## 2020-08-03 MED ORDER — ROSUVASTATIN CALCIUM 20 MG PO TABS
20.0000 mg | ORAL_TABLET | Freq: Every day | ORAL | 3 refills | Status: DC
Start: 2020-08-03 — End: 2021-08-06

## 2020-08-03 NOTE — Addendum Note (Signed)
Addended by: Gerilyn Nestle on: 08/03/2020 09:45 AM   Modules accepted: Orders

## 2020-09-11 ENCOUNTER — Other Ambulatory Visit: Payer: Self-pay

## 2020-09-11 ENCOUNTER — Ambulatory Visit (AMBULATORY_SURGERY_CENTER): Payer: Self-pay | Admitting: *Deleted

## 2020-09-11 VITALS — Ht 72.0 in | Wt 230.0 lb

## 2020-09-11 DIAGNOSIS — Z86010 Personal history of colonic polyps: Secondary | ICD-10-CM

## 2020-09-11 MED ORDER — SUPREP BOWEL PREP KIT 17.5-3.13-1.6 GM/177ML PO SOLN: 1.0000 | Freq: Once | ORAL | 0 refills | Status: AC

## 2020-09-11 NOTE — Progress Notes (Signed)
No egg or soy allergy known to patient  No issues with past sedation with any surgeries or procedures No intubation problems in the past  No FH of Malignant Hyperthermia No diet pills per patient No home 02 use per patient  No blood thinners per patient  Pt denies issues with constipation - has a daily BM soft , no issues  No A fib or A flutter  EMMI video to pt or via [place] 19 guidelines implemented in PV today with Pt and RN  Pt is fully vaccinated  for [employer] given to pt in PV today , Code to Pharmacy   Due to the COVID-19 pandemic we are asking patients to follow certain guidelines.  Pt aware of COVID protocols and [facility] guidelines

## 2020-09-14 ENCOUNTER — Other Ambulatory Visit: Payer: Self-pay

## 2020-09-15 ENCOUNTER — Other Ambulatory Visit: Payer: Self-pay

## 2020-09-15 ENCOUNTER — Other Ambulatory Visit (INDEPENDENT_AMBULATORY_CARE_PROVIDER_SITE_OTHER): Payer: BC Managed Care – PPO

## 2020-09-15 DIAGNOSIS — E785 Hyperlipidemia, unspecified: Secondary | ICD-10-CM

## 2020-09-15 LAB — LIPID PANEL
Cholesterol: 158 mg/dL (ref 0–200)
HDL: 64.1 mg/dL (ref >39.00)
LDL Cholesterol: 84 mg/dL (ref 0–99)
NonHDL: 93.76
Total CHOL/HDL Ratio: 2
Triglycerides: 49 mg/dL (ref 0.0–149.0)
VLDL: 9.8 mg/dL (ref 0.0–40.0)

## 2020-09-15 LAB — HEPATIC FUNCTION PANEL
ALT: 21 U/L (ref 0–53)
AST: 23 U/L (ref 0–37)
Albumin: 4.9 g/dL (ref 3.5–5.2)
Alkaline Phosphatase: 63 U/L (ref 39–117)
Bilirubin, Direct: 0.1 mg/dL (ref 0.0–0.3)
Total Bilirubin: 0.4 mg/dL (ref 0.2–1.2)
Total Protein: 7.5 g/dL (ref 6.0–8.3)

## 2020-09-20 ENCOUNTER — Encounter: Payer: Self-pay | Admitting: Gastroenterology

## 2020-09-25 ENCOUNTER — Other Ambulatory Visit: Payer: Self-pay

## 2020-09-25 ENCOUNTER — Ambulatory Visit (AMBULATORY_SURGERY_CENTER): Payer: BC Managed Care – PPO | Admitting: Gastroenterology

## 2020-09-25 ENCOUNTER — Encounter: Payer: Self-pay | Admitting: Gastroenterology

## 2020-09-25 VITALS — BP 135/86 | HR 84 | Temp 98.0°F | Resp 19 | Ht 72.0 in | Wt 230.0 lb

## 2020-09-25 DIAGNOSIS — Z86010 Personal history of colonic polyps: Secondary | ICD-10-CM

## 2020-09-25 DIAGNOSIS — D124 Benign neoplasm of descending colon: Secondary | ICD-10-CM

## 2020-09-25 DIAGNOSIS — Z1211 Encounter for screening for malignant neoplasm of colon: Secondary | ICD-10-CM | POA: Diagnosis not present

## 2020-09-25 DIAGNOSIS — D120 Benign neoplasm of cecum: Secondary | ICD-10-CM

## 2020-09-25 DIAGNOSIS — K639 Disease of intestine, unspecified: Secondary | ICD-10-CM | POA: Diagnosis not present

## 2020-09-25 MED ORDER — SODIUM CHLORIDE 0.9 % IV SOLN: 500.0000 mL | INTRAVENOUS | Status: DC

## 2020-09-25 NOTE — Op Note (Signed)
[name] Patient Name: Ralph Figueroa Procedure Date: 09/25/2020 7:16 AM MRN: [id number] Endoscopist: Ladene Artist , MD Age: 60 Referring MD:  Date of Birth: October 12, 1960 Gender: Male Account #: [id number] Procedure:                Colonoscopy Indications:              Surveillance: Personal history of adenomatous                            polyps on last colonoscopy 3 years ago Medicines:                Monitored Anesthesia Care Procedure:                Pre-Anesthesia Assessment:                           - Prior to the procedure, a History and Physical                            was performed, and patient medications and                            allergies were reviewed. The patient's tolerance of                            previous anesthesia was also reviewed. The risks                            and benefits of the procedure and the sedation                            options and risks were discussed with the patient.                            All questions were answered, and informed consent                            was obtained. Prior Anticoagulants: The patient has                            taken no previous anticoagulant or antiplatelet                            agents. ASA Grade Assessment: II - A patient with                            mild systemic disease. After reviewing the risks                            and benefits, the patient was deemed in                            satisfactory condition to undergo the procedure.  After obtaining informed consent, the colonoscope                            was passed under direct vision. Throughout the                            procedure, the patient's blood pressure, pulse, and                            oxygen saturations were monitored continuously. The                            Olympus [id number]) [id number] was introduced                            through the anus and  advanced to the the cecum,                            identified by appendiceal orifice and ileocecal                            valve. The ileocecal valve, appendiceal orifice,                            and rectum were photographed. The quality of the                            bowel preparation was adequate after extensive                            lavage, suction. The colonoscopy was performed                            without difficulty. The patient tolerated the                            procedure well. Scope In: 8:06:39 AM Scope Out: 8:23:35 AM Scope Withdrawal Time: 0 hours 14 minutes 34 seconds  Total Procedure Duration: 0 hours 16 minutes 56 seconds  Findings:                 The perianal and digital rectal examinations were                            normal.                           A 10 mm polyp was found in the cecum. The polyp was                            sessile. The polyp was removed with a cold snare.                            Resection and retrieval were complete.  One 7 mm submucosal nodule was found in the cecum.                            Biopsies were taken with a cold forceps for                            histology.                           A 7 mm polyp was found in the descending colon. The                            polyp was sessile. The polyp was removed with a                            cold snare. Resection and retrieval were complete.                           Many small-mouthed diverticula were found in the                            left colon. There was evidence of diverticular                            spasm.                           Internal hemorrhoids were found during                            retroflexion. The hemorrhoids were small and Grade                            I (internal hemorrhoids that do not prolapse).                           The exam was otherwise without abnormality on                             direct and retroflexion views. Complications:            No immediate complications. Estimated blood loss:                            None. Estimated Blood Loss:     Estimated blood loss: none. Impression:               - One 10 mm polyp in the cecum, removed with a cold                            snare. Resected and retrieved.                           - Submucosal nodule in the cecum. Biopsied.                           -  One 7 mm polyp in the descending colon, removed                            with a cold snare. Resected and retrieved.                           - Moderate diverticulosis in the left colon.                           - Internal hemorrhoids.                           - The examination was otherwise normal on direct                            and retroflexion views. Recommendation:           - Repeat colonoscopy after studies are complete for                            surveillance based on pathology results with a more                            extensive bowel prep.                           - Patient has a contact number available for                            emergencies. The signs and symptoms of potential                            delayed complications were discussed with the                            patient. Return to normal activities tomorrow.                            Written discharge instructions were provided to the                            patient.                           - High fiber diet.                           - Continue present medications.                           - Await pathology results. Ladene Artist, MD 09/25/2020 8:28:28 AM This report has been signed electronically.

## 2020-09-25 NOTE — Patient Instructions (Addendum)
Handouts were given to you on polyps, diverticulosis, hemorrhoids, and a high fiber diet with liberal fluid intake. You may resume your current medications today. Await biopsy results.  May take 1-3 weeks to receive pathology results. Please call if any questions or concerns.    YOU HAD AN ENDOSCOPIC PROCEDURE TODAY AT [facility]:   Refer to the procedure report that was given to you for any specific questions about what was found during the examination.  If the procedure report does not answer your questions, please call your gastroenterologist to clarify.  If you requested that your care partner not be given the details of your procedure findings, then the procedure report has been included in a sealed envelope for you to review at your convenience later.  YOU SHOULD EXPECT: Some feelings of bloating in the abdomen. Passage of more gas than usual.  Walking can help get rid of the air that was put into your GI tract during the procedure and reduce the bloating. If you had a lower endoscopy (such as a colonoscopy or flexible sigmoidoscopy) you may notice spotting of blood in your stool or on the toilet paper. If you underwent a bowel prep for your procedure, you may not have a normal bowel movement for a few days.  Please Note:  You might notice some irritation and congestion in your nose or some drainage.  This is from the oxygen used during your procedure.  There is no need for concern and it should clear up in a day or so.  SYMPTOMS TO REPORT IMMEDIATELY:   Following lower endoscopy (colonoscopy or flexible sigmoidoscopy):  Excessive amounts of blood in the stool  Significant tenderness or worsening of abdominal pains  Swelling of the abdomen that is new, acute  Fever of 100F or higher   For urgent or emergent issues, a gastroenterologist can be reached at any hour by calling [phone]. Do not use MyChart messaging for urgent concerns.    DIET:  We do recommend a  small meal at first, but then you may proceed to your regular diet.  Drink plenty of fluids but you should avoid alcoholic beverages for 24 hours.  ACTIVITY:  You should plan to take it easy for the rest of today and you should NOT DRIVE or use heavy machinery until tomorrow (because of the sedation medicines used during the test).    FOLLOW UP: Our staff will call the number listed on your records 48-72 hours following your procedure to check on you and address any questions or concerns that you may have regarding the information given to you following your procedure. If we do not reach you, we will leave a message.  We will attempt to reach you two times.  During this call, we will ask if you have developed any symptoms of COVID 19. If you develop any symptoms (ie: fever, flu-like symptoms, shortness of breath, cough etc.) before then, please call [phone].  If you test positive for Covid 19 in the 2 weeks post procedure, please call and report this information to [place].    If any biopsies were taken you will be contacted by phone or by letter within the next 1-3 weeks.  Please call us at [phone] if you have not heard about the biopsies in 3 weeks.    SIGNATURES/CONFIDENTIALITY: You and/or your care partner have signed paperwork which will be entered into your electronic medical record.  These signatures attest to the fact that that the  information above on your After Visit Summary has been reviewed and is understood.  Full responsibility of the confidentiality of this discharge information lies with you and/or your care-partner.

## 2020-09-25 NOTE — Progress Notes (Signed)
Called to room to assist during endoscopic procedure.  Patient ID and intended procedure confirmed with present staff. Received instructions for my participation in the procedure from the performing physician.  

## 2020-09-25 NOTE — Progress Notes (Signed)
V/s NS Pt's states no medical or surgical changes since previsit or office visit. 

## 2020-09-25 NOTE — Progress Notes (Signed)
No problems noted in the recovery room. maw 

## 2020-09-27 ENCOUNTER — Telehealth: Payer: Self-pay

## 2020-09-27 NOTE — Telephone Encounter (Signed)
  Follow up Call-  Call back number 09/25/2020  Post procedure Call Back phone  # [phone]  Permission to leave phone message Yes  Some recent data might be hidden     Patient questions:  Do you have a fever, pain , or abdominal swelling? No. Pain Score  0 *  Have you tolerated food without any problems? Yes.    Have you been able to return to your normal activities? Yes.    Do you have any questions about your discharge instructions: Diet   No. Medications  No. Follow up visit  No.  Do you have questions or concerns about your Care? No.  Actions: * If pain score is 4 or above: No action needed, pain <4. 1. Have you developed a fever since your procedure? no  2.   Have you had an respiratory symptoms (SOB or cough) since your procedure? no  3.   Have you tested positive for COVID 19 since your procedure no  4.   Have you had any family members/close contacts diagnosed with the COVID 19 since your procedure?  no   If yes to any of these questions please route to Joylene John, RN and Joella Prince, RN

## 2020-10-04 ENCOUNTER — Encounter: Payer: Self-pay | Admitting: Gastroenterology

## 2021-01-31 ENCOUNTER — Other Ambulatory Visit: Payer: Self-pay

## 2021-01-31 ENCOUNTER — Encounter: Payer: Self-pay | Admitting: Family Medicine

## 2021-01-31 ENCOUNTER — Ambulatory Visit (INDEPENDENT_AMBULATORY_CARE_PROVIDER_SITE_OTHER): Payer: BC Managed Care – PPO | Admitting: Family Medicine

## 2021-01-31 VITALS — BP 130/80 | HR 64 | Temp 98.3°F | Ht 71.0 in | Wt 233.0 lb

## 2021-01-31 DIAGNOSIS — E785 Hyperlipidemia, unspecified: Secondary | ICD-10-CM

## 2021-01-31 LAB — COMPREHENSIVE METABOLIC PANEL
ALT: 49 U/L (ref 0–53)
AST: 31 U/L (ref 0–37)
Albumin: 4.6 g/dL (ref 3.5–5.2)
Alkaline Phosphatase: 60 U/L (ref 39–117)
BUN: 16 mg/dL (ref 6–23)
CO2: 27 mEq/L (ref 19–32)
Calcium: 9.2 mg/dL (ref 8.4–10.5)
Chloride: 105 mEq/L (ref 96–112)
Creatinine, Ser: 0.84 mg/dL (ref 0.40–1.50)
GFR: 93.88 mL/min (ref >60.00)
Glucose, Bld: 111 mg/dL — ABNORMAL HIGH (ref 70–99)
Potassium: 4.8 mEq/L (ref 3.5–5.1)
Sodium: 139 mEq/L (ref 135–145)
Total Bilirubin: 0.4 mg/dL (ref 0.2–1.2)
Total Protein: 6.9 g/dL (ref 6.0–8.3)

## 2021-01-31 LAB — LIPID PANEL
Cholesterol: 177 mg/dL (ref 0–200)
HDL: 57.1 mg/dL (ref >39.00)
LDL Cholesterol: 110 mg/dL — ABNORMAL HIGH (ref 0–99)
NonHDL: 119.91
Total CHOL/HDL Ratio: 3
Triglycerides: 52 mg/dL (ref 0.0–149.0)
VLDL: 10.4 mg/dL (ref 0.0–40.0)

## 2021-01-31 NOTE — Progress Notes (Signed)
Chief Complaint  Patient presents with  . Follow-up    Subjective: Hyperlipidemia Patient presents for Hyperlipidemia follow up. Currently taking Crestor 20 mg/d and compliance with treatment thus far has been good. He denies myalgias. He is sometimes adhering to a healthy diet. Exercise: elliptical, wt resistance exercise, walking The patient is not known to have coexisting coronary artery disease. No Cp or SOB.   Past Medical History:  Diagnosis Date  . Allergy   . Arthritis    hands   . Gastric ulcer   . GERD (gastroesophageal reflux disease)    past hx   . Hyperlipidemia    borderline  . Migraines   . Seasonal allergies   . Tubular adenoma of colon 07/2017    Objective: BP 130/80 (BP Location: Left Arm, Patient Position: Sitting, Cuff Size: Normal)   Pulse 64   Temp 98.3 F (36.8 C) (Oral)   Ht 5\' 11"  (1.803 m)   Wt 233 lb (105.7 kg)   SpO2 95%   BMI 32.50 kg/m  General: Awake, appears stated age HEENT: MMM Heart: RRR, no LE edema, no bruits Lungs: CTAB, no rales, wheezes or rhonchi. No accessory muscle use Psych: Age appropriate judgment and insight, normal affect and mood  Assessment and Plan: Hyperlipidemia, unspecified hyperlipidemia type  Chronic, stable. Cont Crestor 20 mg/d. Counseled on diet/exercise.  F/u in 6 mo for CPE or prn. The patient voiced understanding and agreement to the plan.  [place], DO 01/31/21  7:54 AM

## 2021-01-31 NOTE — Patient Instructions (Signed)
Give us 2-3 business days to get the results of your labs back.   Keep the diet clean and stay active.  Let us know if you need anything. 

## 2021-01-31 NOTE — Addendum Note (Signed)
Addended by: Ames Coupe on: 01/31/2021 08:08 AM   Modules accepted: Orders

## 2021-04-04 ENCOUNTER — Other Ambulatory Visit: Payer: Self-pay | Admitting: Family Medicine

## 2021-04-04 ENCOUNTER — Telehealth: Payer: Self-pay

## 2021-04-04 MED ORDER — METHYLPREDNISOLONE 4 MG PO TBPK: ORAL_TABLET | ORAL | 0 refills | Status: DC

## 2021-04-04 NOTE — Telephone Encounter (Signed)
Pt would a steroid pack for the inflammation in foot.Pt states that he has had this before.

## 2021-08-06 ENCOUNTER — Telehealth: Payer: Self-pay | Admitting: Family Medicine

## 2021-08-06 MED ORDER — ROSUVASTATIN CALCIUM 20 MG PO TABS: 20.0000 mg | ORAL_TABLET | Freq: Every day | ORAL | 3 refills | Status: DC

## 2021-08-06 NOTE — Telephone Encounter (Signed)
Medication: rosuvastatin (CRESTOR) 20 MG tablet   Has the patient contacted their pharmacy? Yes.   (If no, request that the patient contact the pharmacy for the refill.) (If yes, when and what did the pharmacy advise?)  Preferred Pharmacy (with phone number or street name):  [id number] [place], [place] - [address]  [address] [place], [place] [address]  Phone:  [phone]  Fax:  [phone]   Agent: Please be advised that RX refills may take up to 3 business days. We ask that you follow-up with your pharmacy.

## 2022-07-17 ENCOUNTER — Encounter: Payer: BC Managed Care – PPO | Admitting: Family Medicine

## 2022-08-02 ENCOUNTER — Other Ambulatory Visit: Payer: Self-pay | Admitting: Family Medicine

## 2022-08-07 ENCOUNTER — Encounter: Payer: Self-pay | Admitting: Family Medicine

## 2022-08-07 ENCOUNTER — Ambulatory Visit (INDEPENDENT_AMBULATORY_CARE_PROVIDER_SITE_OTHER): Payer: BC Managed Care – PPO | Admitting: Family Medicine

## 2022-08-07 VITALS — BP 132/82 | HR 73 | Temp 98.5°F | Ht 71.5 in | Wt 210.2 lb

## 2022-08-07 DIAGNOSIS — Z0000 Encounter for general adult medical examination without abnormal findings: Secondary | ICD-10-CM | POA: Diagnosis not present

## 2022-08-07 DIAGNOSIS — Z7184 Encounter for health counseling related to travel: Secondary | ICD-10-CM

## 2022-08-07 DIAGNOSIS — Z125 Encounter for screening for malignant neoplasm of prostate: Secondary | ICD-10-CM

## 2022-08-07 LAB — CBC
HCT: 43.7 % (ref 39.0–52.0)
Hemoglobin: 15.1 g/dL (ref 13.0–17.0)
MCHC: 34.5 g/dL (ref 30.0–36.0)
MCV: 93.8 fl (ref 78.0–100.0)
Platelets: 185 10*3/uL (ref 150.0–400.0)
RBC: 4.66 Mil/uL (ref 4.22–5.81)
RDW: 12.9 % (ref 11.5–15.5)
WBC: 5.2 10*3/uL (ref 4.0–10.5)

## 2022-08-07 LAB — COMPREHENSIVE METABOLIC PANEL
ALT: 18 U/L (ref 0–53)
AST: 24 U/L (ref 0–37)
Albumin: 4.9 g/dL (ref 3.5–5.2)
Alkaline Phosphatase: 50 U/L (ref 39–117)
BUN: 19 mg/dL (ref 6–23)
CO2: 29 mEq/L (ref 19–32)
Calcium: 9.5 mg/dL (ref 8.4–10.5)
Chloride: 101 mEq/L (ref 96–112)
Creatinine, Ser: 0.87 mg/dL (ref 0.40–1.50)
GFR: 91.91 mL/min (ref >60.00)
Glucose, Bld: 94 mg/dL (ref 70–99)
Potassium: 4.5 mEq/L (ref 3.5–5.1)
Sodium: 137 mEq/L (ref 135–145)
Total Bilirubin: 0.5 mg/dL (ref 0.2–1.2)
Total Protein: 7.5 g/dL (ref 6.0–8.3)

## 2022-08-07 LAB — LIPID PANEL
Cholesterol: 196 mg/dL (ref 0–200)
HDL: 72.6 mg/dL (ref >39.00)
LDL Cholesterol: 110 mg/dL — ABNORMAL HIGH (ref 0–99)
NonHDL: 123.54
Total CHOL/HDL Ratio: 3
Triglycerides: 67 mg/dL (ref 0.0–149.0)
VLDL: 13.4 mg/dL (ref 0.0–40.0)

## 2022-08-07 LAB — PSA: PSA: 0.95 ng/mL (ref 0.10–4.00)

## 2022-08-07 MED ORDER — TRAZODONE HCL 50 MG PO TABS: 25.0000 mg | ORAL_TABLET | Freq: Every evening | ORAL | 0 refills | Status: DC | PRN

## 2022-08-07 NOTE — Progress Notes (Signed)
Chief Complaint  Patient presents with   Annual Exam    [name] is here for a complete physical.   His last physical was >1 year ago.  Current diet: in general, a "healthy" diet.  Current exercise: P90x3, hiking/walking, lifting wts Weight trend: intentionally lost Fatigue out of ordinary? No. Seat belt? Yes.   Advanced directive? Yes  Health maintenance Shingrix- Yes Colonoscopy- Yes Tetanus- Yes HIV- Yes Hep C- Yes   Past Medical History:  Diagnosis Date   Allergy    Arthritis    hands    Gastric ulcer    GERD (gastroesophageal reflux disease)    past hx    Hyperlipidemia    borderline   Migraines    Seasonal allergies    Tubular adenoma of colon 07/2017      Past Surgical History:  Procedure Laterality Date   COLONOSCOPY     HYDROCELE EXCISION     POLYPECTOMY     TIBIA FRACTURE SURGERY Right    and fibula   UPPER GASTROINTESTINAL ENDOSCOPY     WISDOM TOOTH EXTRACTION      Medications  Current Outpatient Medications on File Prior to Visit  Medication Sig Dispense Refill   acetaminophen (TYLENOL) 500 MG tablet Take 500 mg by mouth every 6 (six) hours as needed.     Multiple Vitamins-Minerals (MULTIVITAMIN MEN 50+ PO) Take by mouth.     Omega-3 Fatty Acids (FISH OIL PO) Take by mouth. [place] Fish oil daily     rosuvastatin (CRESTOR) 20 MG tablet TAKE ONE TABLET BY MOUTH DAILY 90 tablet 3    Allergies Allergies  Allergen Reactions   Shellfish Allergy Rash    Family History Family History  Adopted: Yes  Problem Relation Age of Onset   Cancer Neg Hx    Colon cancer Neg Hx    Colon polyps Neg Hx    Esophageal cancer Neg Hx    Rectal cancer Neg Hx    Stomach cancer Neg Hx     Review of Systems: Constitutional:  no fevers Eye:  no recent significant change in vision Ear/Nose/Mouth/Throat:  Ears:  no hearing loss Nose/Mouth/Throat:  no complaints of nasal congestion, no sore throat Cardiovascular:  no chest pain Respiratory:   no shortness of breath Gastrointestinal:  no change in bowel habits GU:  Male: negative for dysuria, frequency Musculoskeletal/Extremities:  no joint pain Integumentary (Skin/Breast):  no abnormal skin lesions reported Neurologic:  no headaches Endocrine: No unexpected weight changes Hematologic/Lymphatic:  no abnormal bleeding  Exam BP 132/82 (BP Location: Left Arm, Patient Position: Sitting, Cuff Size: Normal)   Pulse 73   Temp 98.5 F (36.9 C) (Oral)   Ht 5' 11.5" (1.816 m)   Wt 210 lb 4 oz (95.4 kg)   SpO2 97%   BMI 28.92 kg/m  General:  well developed, well nourished, in no apparent distress Skin:  no significant moles, warts, or growths Head:  no masses, lesions, or tenderness Eyes:  pupils equal and round, sclera anicteric without injection Ears:  canals without lesions, TMs shiny without retraction, no obvious effusion, no erythema Nose:  nares patent, mucosa normal Throat/Pharynx:  lips and gingiva without lesion; tongue and uvula midline; non-inflamed pharynx; no exudates or postnasal drainage Neck: neck supple without adenopathy, thyromegaly, or masses Cardiac: RRR, no bruits, no LE edema Lungs:  clear to auscultation, breath sounds equal bilaterally, no respiratory distress Abdomen: BS+, soft, non-tender, non-distended, no masses or organomegaly noted Rectal: Deferred Musculoskeletal:  symmetrical muscle groups noted without atrophy or deformity Neuro:  gait normal; deep tendon reflexes normal and symmetric Psych: well oriented with normal range of affect and appropriate judgment/insight  Assessment and Plan  Well adult exam - Plan: CBC, Comprehensive metabolic panel, Lipid panel  Screening for prostate cancer - Plan: PSA   Well 61 y.o. male. Counseled on diet and exercise. Counseled on risks and benefits of prostate cancer screening with PSA. The patient agrees to undergo testing. Advanced directive form requested today.  Immunizations, labs, and further  orders as above. Follow up in 6 mo or prn. The patient voiced understanding and agreement to the plan.  [place], DO 08/07/22 10:11 AM

## 2022-08-07 NOTE — Patient Instructions (Addendum)
Give [place] 2-3 business days to get the results of your labs back.   Keep the diet clean and stay active.  Take the medicine for a test ride a few nights and let me know if you are not pleased with the efficacy.   Let us know if you need anything.

## 2022-09-02 ENCOUNTER — Other Ambulatory Visit: Payer: Self-pay | Admitting: Family Medicine

## 2022-09-02 DIAGNOSIS — Z7184 Encounter for health counseling related to travel: Secondary | ICD-10-CM

## 2022-09-13 ENCOUNTER — Telehealth: Payer: Self-pay | Admitting: Family Medicine

## 2022-09-13 NOTE — Telephone Encounter (Signed)
Pt states has new insurance for this year [employer] farm [employer])  and is needing to change his pharmacy to [facility] on [address]. If any questions please contact pt at [phone].

## 2022-09-16 NOTE — Telephone Encounter (Signed)
Change completed.

## 2023-08-17 ENCOUNTER — Other Ambulatory Visit: Payer: Self-pay | Admitting: Family Medicine

## 2023-08-25 ENCOUNTER — Ambulatory Visit (INDEPENDENT_AMBULATORY_CARE_PROVIDER_SITE_OTHER): Payer: Managed Care, Other (non HMO) | Admitting: Family Medicine

## 2023-08-25 ENCOUNTER — Encounter: Payer: Self-pay | Admitting: Family Medicine

## 2023-08-25 VITALS — BP 128/82 | HR 70 | Temp 97.8°F | Resp 16 | Ht 71.5 in | Wt 222.6 lb

## 2023-08-25 DIAGNOSIS — Z125 Encounter for screening for malignant neoplasm of prostate: Secondary | ICD-10-CM

## 2023-08-25 DIAGNOSIS — Z0000 Encounter for general adult medical examination without abnormal findings: Secondary | ICD-10-CM

## 2023-08-25 LAB — COMPREHENSIVE METABOLIC PANEL
ALT: 28 U/L (ref 0–53)
AST: 30 U/L (ref 0–37)
Albumin: 4.8 g/dL (ref 3.5–5.2)
Alkaline Phosphatase: 48 U/L (ref 39–117)
BUN: 18 mg/dL (ref 6–23)
CO2: 29 meq/L (ref 19–32)
Calcium: 9.3 mg/dL (ref 8.4–10.5)
Chloride: 100 meq/L (ref 96–112)
Creatinine, Ser: 0.83 mg/dL (ref 0.40–1.50)
GFR: 92.54 mL/min (ref >60.00)
Glucose, Bld: 112 mg/dL — ABNORMAL HIGH (ref 70–99)
Potassium: 4.6 meq/L (ref 3.5–5.1)
Sodium: 138 meq/L (ref 135–145)
Total Bilirubin: 0.5 mg/dL (ref 0.2–1.2)
Total Protein: 7.3 g/dL (ref 6.0–8.3)

## 2023-08-25 LAB — CBC
HCT: 45.8 % (ref 39.0–52.0)
Hemoglobin: 15.4 g/dL (ref 13.0–17.0)
MCHC: 33.7 g/dL (ref 30.0–36.0)
MCV: 95.2 fL (ref 78.0–100.0)
Platelets: 175 10*3/uL (ref 150.0–400.0)
RBC: 4.81 Mil/uL (ref 4.22–5.81)
RDW: 13 % (ref 11.5–15.5)
WBC: 5.7 10*3/uL (ref 4.0–10.5)

## 2023-08-25 LAB — LIPID PANEL
Cholesterol: 160 mg/dL (ref 0–200)
HDL: 67.8 mg/dL (ref >39.00)
LDL Cholesterol: 76 mg/dL (ref 0–99)
NonHDL: 92.03
Total CHOL/HDL Ratio: 2
Triglycerides: 78 mg/dL (ref 0.0–149.0)
VLDL: 15.6 mg/dL (ref 0.0–40.0)

## 2023-08-25 LAB — PSA: PSA: 1.05 ng/mL (ref 0.10–4.00)

## 2023-08-25 NOTE — Patient Instructions (Addendum)
Give us 2-3 business days to get the results of your labs back.   Keep the diet clean and stay active.  Let us know if you need anything. 

## 2023-08-25 NOTE — Progress Notes (Signed)
Chief Complaint  Patient presents with   Annual Exam    Pt states fasting     [name] is here for a complete physical.   His last physical was >1 year ago.  Current diet: in general, a "healthy" diet.  Current exercise: strength training, walking, P90x Weight trend: stable Fatigue out of ordinary? No. Seat belt? Yes.   Advanced directive? Yes  Health maintenance Shingrix- Yes Colonoscopy- Yes Tetanus- Yes HIV- Yes Hep C- Yes   Past Medical History:  Diagnosis Date   Allergy    Arthritis    hands    Gastric ulcer    GERD (gastroesophageal reflux disease)    past hx    Hyperlipidemia    borderline   Migraines    Seasonal allergies    Tubular adenoma of colon 07/2017      Past Surgical History:  Procedure Laterality Date   COLONOSCOPY     HYDROCELE EXCISION     POLYPECTOMY     TIBIA FRACTURE SURGERY Right    and fibula   UPPER GASTROINTESTINAL ENDOSCOPY     WISDOM TOOTH EXTRACTION      Medications  Current Outpatient Medications on File Prior to Visit  Medication Sig Dispense Refill   acetaminophen (TYLENOL) 500 MG tablet Take 500 mg by mouth every 6 (six) hours as needed.     Multiple Vitamins-Minerals (MULTIVITAMIN MEN 50+ PO) Take by mouth.     Omega-3 Fatty Acids (FISH OIL PO) Take by mouth. [place] Fish oil daily     rosuvastatin (CRESTOR) 20 MG tablet TAKE 1 TABLET BY MOUTH EVERY DAY 30 tablet 8   traZODone (DESYREL) 50 MG tablet TAKE 0.5 TO 1 TABLET BY MOUTH EVERY NIGHT AT BEDTIME AS NEEDED FOR SLEEP 30 tablet 0   Allergies Allergies  Allergen Reactions   Shellfish Allergy Rash    Family History Family History  Adopted: Yes  Problem Relation Age of Onset   Cancer Neg Hx    Colon cancer Neg Hx    Colon polyps Neg Hx    Esophageal cancer Neg Hx    Rectal cancer Neg Hx    Stomach cancer Neg Hx     Review of Systems: Constitutional:  no fevers Eye:  no recent significant change in vision Ear/Nose/Mouth/Throat:  Ears:  no  hearing loss Nose/Mouth/Throat:  no complaints of nasal congestion, no sore throat Cardiovascular:  no chest pain Respiratory:  no shortness of breath Gastrointestinal:  no change in bowel habits GU:  Male: negative for dysuria, frequency Musculoskeletal/Extremities:  no joint pain Integumentary (Skin/Breast):  no abnormal skin lesions reported Neurologic:  no headaches Endocrine: No unexpected weight changes Hematologic/Lymphatic:  no abnormal bleeding  Exam BP 128/82 (BP Location: Left Arm, Cuff Size: Normal)   Pulse 70   Temp 97.8 F (36.6 C) (Oral)   Resp 16   Ht 5' 11.5" (1.816 m)   Wt 222 lb 9.6 oz (101 kg)   SpO2 97%   BMI 30.61 kg/m  General:  well developed, well nourished, in no apparent distress Skin:  no significant moles, warts, or growths Head:  no masses, lesions, or tenderness Eyes:  pupils equal and round, sclera anicteric without injection Ears:  canals without lesions, TMs shiny without retraction, no obvious effusion, no erythema Nose:  nares patent, mucosa normal Throat/Pharynx:  lips and gingiva without lesion; tongue and uvula midline; non-inflamed pharynx; no exudates or postnasal drainage Neck: neck supple without adenopathy, thyromegaly, or masses Cardiac:  RRR, no bruits, no LE edema Lungs:  clear to auscultation, breath sounds equal bilaterally, no respiratory distress Abdomen: BS+, soft, non-tender, non-distended, no masses or organomegaly noted Rectal: Deferred Musculoskeletal:  symmetrical muscle groups noted without atrophy or deformity Neuro:  gait normal; deep tendon reflexes normal and symmetric Psych: well oriented with normal range of affect and appropriate judgment/insight  Assessment and Plan  Well adult exam - Plan: CBC, Comprehensive metabolic panel, Lipid panel  Screening for prostate cancer - Plan: PSA   [name]. Counseled on diet and exercise. Counseled on risks and benefits of prostate cancer screening with PSA. The  patient agrees to undergo testing. Immunizations, labs, and further orders as above. Follow up in 6 mo. The patient voiced understanding and agreement to the plan.  Jilda Roche [place], DO 08/25/23 9:56 AM

## 2023-08-26 ENCOUNTER — Ambulatory Visit (INDEPENDENT_AMBULATORY_CARE_PROVIDER_SITE_OTHER): Payer: Self-pay

## 2023-08-26 ENCOUNTER — Other Ambulatory Visit: Payer: Self-pay

## 2023-08-26 DIAGNOSIS — R739 Hyperglycemia, unspecified: Secondary | ICD-10-CM | POA: Diagnosis not present

## 2023-08-26 LAB — HEMOGLOBIN A1C: Hgb A1c MFr Bld: 5.6 % (ref 4.6–6.5)

## 2024-04-01 ENCOUNTER — Other Ambulatory Visit: Payer: Self-pay | Admitting: Family Medicine

## 2024-07-06 ENCOUNTER — Telehealth: Payer: Self-pay

## 2024-07-06 NOTE — Telephone Encounter (Signed)
 Patient is overdue for an appointment. LMOM for patient to call and schedule.

## 2024-07-20 ENCOUNTER — Telehealth: Payer: Self-pay

## 2024-07-20 NOTE — Telephone Encounter (Signed)
 Patient is overdue for an appointment with PCP. LMOM for patient to call and schedule.

## 2024-09-25 ENCOUNTER — Other Ambulatory Visit: Payer: Self-pay | Admitting: Family Medicine
